# Patient Record
Sex: Male | Born: 1945
Health system: Southern US, Community
[De-identification: ages and names within clinical notes are randomized; demographics above are authoritative.]

## PROBLEM LIST (undated history)

## (undated) DIAGNOSIS — H269 Unspecified cataract: Secondary | ICD-10-CM

## (undated) DIAGNOSIS — G510 Bell's palsy: Secondary | ICD-10-CM

## (undated) DIAGNOSIS — E119 Type 2 diabetes mellitus without complications: Secondary | ICD-10-CM

## (undated) DIAGNOSIS — I10 Essential (primary) hypertension: Secondary | ICD-10-CM

## (undated) DIAGNOSIS — F419 Anxiety disorder, unspecified: Secondary | ICD-10-CM

## (undated) HISTORY — DX: Unspecified cataract: H26.9

## (undated) HISTORY — PX: COLONOSCOPY: SHX174

## (undated) HISTORY — DX: Anxiety disorder, unspecified: F41.9

## (undated) HISTORY — DX: Type 2 diabetes mellitus without complications: E11.9

## (undated) HISTORY — DX: Essential (primary) hypertension: I10

## (undated) HISTORY — DX: Bell's palsy: G51.0

---

## 2002-12-01 HISTORY — PX: POLYPECTOMY: SHX149

## 2015-03-12 DIAGNOSIS — E1122 Type 2 diabetes mellitus with diabetic chronic kidney disease: Secondary | ICD-10-CM | POA: Diagnosis not present

## 2015-03-12 DIAGNOSIS — I10 Essential (primary) hypertension: Secondary | ICD-10-CM | POA: Diagnosis not present

## 2015-03-20 DIAGNOSIS — Z23 Encounter for immunization: Secondary | ICD-10-CM | POA: Diagnosis not present

## 2015-03-20 DIAGNOSIS — E1122 Type 2 diabetes mellitus with diabetic chronic kidney disease: Secondary | ICD-10-CM | POA: Diagnosis not present

## 2015-03-20 DIAGNOSIS — N182 Chronic kidney disease, stage 2 (mild): Secondary | ICD-10-CM | POA: Diagnosis not present

## 2015-03-20 DIAGNOSIS — I10 Essential (primary) hypertension: Secondary | ICD-10-CM | POA: Diagnosis not present

## 2015-03-20 DIAGNOSIS — E785 Hyperlipidemia, unspecified: Secondary | ICD-10-CM | POA: Diagnosis not present

## 2015-06-26 DIAGNOSIS — H2513 Age-related nuclear cataract, bilateral: Secondary | ICD-10-CM | POA: Diagnosis not present

## 2015-06-26 DIAGNOSIS — H18413 Arcus senilis, bilateral: Secondary | ICD-10-CM | POA: Diagnosis not present

## 2015-12-21 DIAGNOSIS — Z125 Encounter for screening for malignant neoplasm of prostate: Secondary | ICD-10-CM | POA: Diagnosis not present

## 2015-12-21 DIAGNOSIS — E1122 Type 2 diabetes mellitus with diabetic chronic kidney disease: Secondary | ICD-10-CM | POA: Diagnosis not present

## 2015-12-21 DIAGNOSIS — I10 Essential (primary) hypertension: Secondary | ICD-10-CM | POA: Diagnosis not present

## 2016-01-29 DIAGNOSIS — H34811 Central retinal vein occlusion, right eye, with macular edema: Secondary | ICD-10-CM | POA: Diagnosis not present

## 2016-02-26 DIAGNOSIS — Z23 Encounter for immunization: Secondary | ICD-10-CM | POA: Diagnosis not present

## 2016-02-26 DIAGNOSIS — I129 Hypertensive chronic kidney disease with stage 1 through stage 4 chronic kidney disease, or unspecified chronic kidney disease: Secondary | ICD-10-CM | POA: Diagnosis not present

## 2016-02-26 DIAGNOSIS — E785 Hyperlipidemia, unspecified: Secondary | ICD-10-CM | POA: Diagnosis not present

## 2016-02-26 DIAGNOSIS — Z0001 Encounter for general adult medical examination with abnormal findings: Secondary | ICD-10-CM | POA: Diagnosis not present

## 2016-02-26 DIAGNOSIS — Z1389 Encounter for screening for other disorder: Secondary | ICD-10-CM | POA: Diagnosis not present

## 2016-02-26 DIAGNOSIS — N182 Chronic kidney disease, stage 2 (mild): Secondary | ICD-10-CM | POA: Diagnosis not present

## 2016-02-26 DIAGNOSIS — E1122 Type 2 diabetes mellitus with diabetic chronic kidney disease: Secondary | ICD-10-CM | POA: Diagnosis not present

## 2016-03-12 DIAGNOSIS — H3581 Retinal edema: Secondary | ICD-10-CM | POA: Diagnosis not present

## 2016-03-12 DIAGNOSIS — H3561 Retinal hemorrhage, right eye: Secondary | ICD-10-CM | POA: Diagnosis not present

## 2016-03-12 DIAGNOSIS — H43811 Vitreous degeneration, right eye: Secondary | ICD-10-CM | POA: Diagnosis not present

## 2016-03-12 DIAGNOSIS — H34811 Central retinal vein occlusion, right eye, with macular edema: Secondary | ICD-10-CM | POA: Diagnosis not present

## 2016-04-09 DIAGNOSIS — H34811 Central retinal vein occlusion, right eye, with macular edema: Secondary | ICD-10-CM | POA: Diagnosis not present

## 2016-04-23 DIAGNOSIS — H34811 Central retinal vein occlusion, right eye, with macular edema: Secondary | ICD-10-CM | POA: Diagnosis not present

## 2016-04-23 DIAGNOSIS — H43822 Vitreomacular adhesion, left eye: Secondary | ICD-10-CM | POA: Diagnosis not present

## 2016-05-21 DIAGNOSIS — E1122 Type 2 diabetes mellitus with diabetic chronic kidney disease: Secondary | ICD-10-CM | POA: Diagnosis not present

## 2016-05-21 DIAGNOSIS — I129 Hypertensive chronic kidney disease with stage 1 through stage 4 chronic kidney disease, or unspecified chronic kidney disease: Secondary | ICD-10-CM | POA: Diagnosis not present

## 2016-05-28 DIAGNOSIS — I129 Hypertensive chronic kidney disease with stage 1 through stage 4 chronic kidney disease, or unspecified chronic kidney disease: Secondary | ICD-10-CM | POA: Diagnosis not present

## 2016-05-28 DIAGNOSIS — N182 Chronic kidney disease, stage 2 (mild): Secondary | ICD-10-CM | POA: Diagnosis not present

## 2016-05-28 DIAGNOSIS — E1122 Type 2 diabetes mellitus with diabetic chronic kidney disease: Secondary | ICD-10-CM | POA: Diagnosis not present

## 2016-11-20 DIAGNOSIS — E1122 Type 2 diabetes mellitus with diabetic chronic kidney disease: Secondary | ICD-10-CM | POA: Diagnosis not present

## 2016-11-20 DIAGNOSIS — I129 Hypertensive chronic kidney disease with stage 1 through stage 4 chronic kidney disease, or unspecified chronic kidney disease: Secondary | ICD-10-CM | POA: Diagnosis not present

## 2016-12-02 DIAGNOSIS — Z23 Encounter for immunization: Secondary | ICD-10-CM | POA: Diagnosis not present

## 2016-12-02 DIAGNOSIS — I129 Hypertensive chronic kidney disease with stage 1 through stage 4 chronic kidney disease, or unspecified chronic kidney disease: Secondary | ICD-10-CM | POA: Diagnosis not present

## 2016-12-02 DIAGNOSIS — E1122 Type 2 diabetes mellitus with diabetic chronic kidney disease: Secondary | ICD-10-CM | POA: Diagnosis not present

## 2016-12-02 DIAGNOSIS — N182 Chronic kidney disease, stage 2 (mild): Secondary | ICD-10-CM | POA: Diagnosis not present

## 2016-12-30 DIAGNOSIS — M109 Gout, unspecified: Secondary | ICD-10-CM | POA: Diagnosis not present

## 2017-02-24 DIAGNOSIS — M109 Gout, unspecified: Secondary | ICD-10-CM | POA: Diagnosis not present

## 2017-03-04 DIAGNOSIS — H52223 Regular astigmatism, bilateral: Secondary | ICD-10-CM | POA: Diagnosis not present

## 2017-03-04 DIAGNOSIS — H34811 Central retinal vein occlusion, right eye, with macular edema: Secondary | ICD-10-CM | POA: Diagnosis not present

## 2017-03-09 DIAGNOSIS — E1122 Type 2 diabetes mellitus with diabetic chronic kidney disease: Secondary | ICD-10-CM | POA: Diagnosis not present

## 2017-03-09 DIAGNOSIS — E559 Vitamin D deficiency, unspecified: Secondary | ICD-10-CM | POA: Diagnosis not present

## 2017-03-09 DIAGNOSIS — Z0000 Encounter for general adult medical examination without abnormal findings: Secondary | ICD-10-CM | POA: Diagnosis not present

## 2017-03-09 DIAGNOSIS — I10 Essential (primary) hypertension: Secondary | ICD-10-CM | POA: Diagnosis not present

## 2017-03-09 DIAGNOSIS — E785 Hyperlipidemia, unspecified: Secondary | ICD-10-CM | POA: Diagnosis not present

## 2017-03-09 DIAGNOSIS — M109 Gout, unspecified: Secondary | ICD-10-CM | POA: Diagnosis not present

## 2017-03-09 DIAGNOSIS — Z125 Encounter for screening for malignant neoplasm of prostate: Secondary | ICD-10-CM | POA: Diagnosis not present

## 2017-03-19 DIAGNOSIS — N182 Chronic kidney disease, stage 2 (mild): Secondary | ICD-10-CM | POA: Diagnosis not present

## 2017-03-19 DIAGNOSIS — H35032 Hypertensive retinopathy, left eye: Secondary | ICD-10-CM | POA: Diagnosis not present

## 2017-03-19 DIAGNOSIS — H34811 Central retinal vein occlusion, right eye, with macular edema: Secondary | ICD-10-CM | POA: Diagnosis not present

## 2017-03-19 DIAGNOSIS — E1122 Type 2 diabetes mellitus with diabetic chronic kidney disease: Secondary | ICD-10-CM | POA: Diagnosis not present

## 2017-03-19 DIAGNOSIS — H43813 Vitreous degeneration, bilateral: Secondary | ICD-10-CM | POA: Diagnosis not present

## 2017-03-19 DIAGNOSIS — H43822 Vitreomacular adhesion, left eye: Secondary | ICD-10-CM | POA: Diagnosis not present

## 2017-03-19 DIAGNOSIS — Z0000 Encounter for general adult medical examination without abnormal findings: Secondary | ICD-10-CM | POA: Diagnosis not present

## 2017-03-26 DIAGNOSIS — H34811 Central retinal vein occlusion, right eye, with macular edema: Secondary | ICD-10-CM | POA: Diagnosis not present

## 2017-04-16 DIAGNOSIS — H34811 Central retinal vein occlusion, right eye, with macular edema: Secondary | ICD-10-CM | POA: Diagnosis not present

## 2017-04-16 DIAGNOSIS — H35032 Hypertensive retinopathy, left eye: Secondary | ICD-10-CM | POA: Diagnosis not present

## 2017-04-16 DIAGNOSIS — H43822 Vitreomacular adhesion, left eye: Secondary | ICD-10-CM | POA: Diagnosis not present

## 2017-04-16 DIAGNOSIS — H43813 Vitreous degeneration, bilateral: Secondary | ICD-10-CM | POA: Diagnosis not present

## 2017-06-04 DIAGNOSIS — E559 Vitamin D deficiency, unspecified: Secondary | ICD-10-CM | POA: Diagnosis not present

## 2017-07-16 DIAGNOSIS — H34811 Central retinal vein occlusion, right eye, with macular edema: Secondary | ICD-10-CM | POA: Diagnosis not present

## 2017-07-16 DIAGNOSIS — H43822 Vitreomacular adhesion, left eye: Secondary | ICD-10-CM | POA: Diagnosis not present

## 2017-07-16 DIAGNOSIS — H43813 Vitreous degeneration, bilateral: Secondary | ICD-10-CM | POA: Diagnosis not present

## 2017-07-16 DIAGNOSIS — H35032 Hypertensive retinopathy, left eye: Secondary | ICD-10-CM | POA: Diagnosis not present

## 2017-08-06 DIAGNOSIS — H34811 Central retinal vein occlusion, right eye, with macular edema: Secondary | ICD-10-CM | POA: Diagnosis not present

## 2017-08-27 DIAGNOSIS — H35032 Hypertensive retinopathy, left eye: Secondary | ICD-10-CM | POA: Diagnosis not present

## 2017-08-27 DIAGNOSIS — H43822 Vitreomacular adhesion, left eye: Secondary | ICD-10-CM | POA: Diagnosis not present

## 2017-08-27 DIAGNOSIS — H34811 Central retinal vein occlusion, right eye, with macular edema: Secondary | ICD-10-CM | POA: Diagnosis not present

## 2017-08-27 DIAGNOSIS — H43813 Vitreous degeneration, bilateral: Secondary | ICD-10-CM | POA: Diagnosis not present

## 2017-09-14 DIAGNOSIS — I129 Hypertensive chronic kidney disease with stage 1 through stage 4 chronic kidney disease, or unspecified chronic kidney disease: Secondary | ICD-10-CM | POA: Diagnosis not present

## 2017-09-14 DIAGNOSIS — M109 Gout, unspecified: Secondary | ICD-10-CM | POA: Diagnosis not present

## 2017-09-14 DIAGNOSIS — E1122 Type 2 diabetes mellitus with diabetic chronic kidney disease: Secondary | ICD-10-CM | POA: Diagnosis not present

## 2017-09-14 DIAGNOSIS — E785 Hyperlipidemia, unspecified: Secondary | ICD-10-CM | POA: Diagnosis not present

## 2017-09-14 DIAGNOSIS — E559 Vitamin D deficiency, unspecified: Secondary | ICD-10-CM | POA: Diagnosis not present

## 2017-09-18 DIAGNOSIS — L03019 Cellulitis of unspecified finger: Secondary | ICD-10-CM | POA: Diagnosis not present

## 2017-09-21 DIAGNOSIS — Z1211 Encounter for screening for malignant neoplasm of colon: Secondary | ICD-10-CM | POA: Diagnosis not present

## 2017-09-21 DIAGNOSIS — I129 Hypertensive chronic kidney disease with stage 1 through stage 4 chronic kidney disease, or unspecified chronic kidney disease: Secondary | ICD-10-CM | POA: Diagnosis not present

## 2017-09-21 DIAGNOSIS — E785 Hyperlipidemia, unspecified: Secondary | ICD-10-CM | POA: Diagnosis not present

## 2017-09-21 DIAGNOSIS — E1122 Type 2 diabetes mellitus with diabetic chronic kidney disease: Secondary | ICD-10-CM | POA: Diagnosis not present

## 2017-09-21 DIAGNOSIS — Z23 Encounter for immunization: Secondary | ICD-10-CM | POA: Diagnosis not present

## 2017-10-16 DIAGNOSIS — T1490XA Injury, unspecified, initial encounter: Secondary | ICD-10-CM | POA: Diagnosis not present

## 2017-10-29 DIAGNOSIS — H43813 Vitreous degeneration, bilateral: Secondary | ICD-10-CM | POA: Diagnosis not present

## 2017-10-29 DIAGNOSIS — E119 Type 2 diabetes mellitus without complications: Secondary | ICD-10-CM | POA: Diagnosis not present

## 2017-10-29 DIAGNOSIS — H43822 Vitreomacular adhesion, left eye: Secondary | ICD-10-CM | POA: Diagnosis not present

## 2017-10-29 DIAGNOSIS — H35033 Hypertensive retinopathy, bilateral: Secondary | ICD-10-CM | POA: Diagnosis not present

## 2017-11-19 DIAGNOSIS — H34811 Central retinal vein occlusion, right eye, with macular edema: Secondary | ICD-10-CM | POA: Diagnosis not present

## 2017-12-17 DIAGNOSIS — H35033 Hypertensive retinopathy, bilateral: Secondary | ICD-10-CM | POA: Diagnosis not present

## 2017-12-17 DIAGNOSIS — H34811 Central retinal vein occlusion, right eye, with macular edema: Secondary | ICD-10-CM | POA: Diagnosis not present

## 2017-12-17 DIAGNOSIS — H43822 Vitreomacular adhesion, left eye: Secondary | ICD-10-CM | POA: Diagnosis not present

## 2017-12-17 DIAGNOSIS — H43813 Vitreous degeneration, bilateral: Secondary | ICD-10-CM | POA: Diagnosis not present

## 2018-01-08 DIAGNOSIS — R399 Unspecified symptoms and signs involving the genitourinary system: Secondary | ICD-10-CM | POA: Diagnosis not present

## 2018-03-16 DIAGNOSIS — Z0000 Encounter for general adult medical examination without abnormal findings: Secondary | ICD-10-CM | POA: Diagnosis not present

## 2018-03-16 DIAGNOSIS — E1122 Type 2 diabetes mellitus with diabetic chronic kidney disease: Secondary | ICD-10-CM | POA: Diagnosis not present

## 2018-03-16 DIAGNOSIS — M109 Gout, unspecified: Secondary | ICD-10-CM | POA: Diagnosis not present

## 2018-03-16 DIAGNOSIS — E785 Hyperlipidemia, unspecified: Secondary | ICD-10-CM | POA: Diagnosis not present

## 2018-03-16 DIAGNOSIS — I129 Hypertensive chronic kidney disease with stage 1 through stage 4 chronic kidney disease, or unspecified chronic kidney disease: Secondary | ICD-10-CM | POA: Diagnosis not present

## 2018-03-18 DIAGNOSIS — H34811 Central retinal vein occlusion, right eye, with macular edema: Secondary | ICD-10-CM | POA: Diagnosis not present

## 2018-03-18 DIAGNOSIS — H43822 Vitreomacular adhesion, left eye: Secondary | ICD-10-CM | POA: Diagnosis not present

## 2018-03-18 DIAGNOSIS — H43813 Vitreous degeneration, bilateral: Secondary | ICD-10-CM | POA: Diagnosis not present

## 2018-03-18 DIAGNOSIS — H35033 Hypertensive retinopathy, bilateral: Secondary | ICD-10-CM | POA: Diagnosis not present

## 2018-03-23 DIAGNOSIS — N182 Chronic kidney disease, stage 2 (mild): Secondary | ICD-10-CM | POA: Diagnosis not present

## 2018-03-23 DIAGNOSIS — Z0000 Encounter for general adult medical examination without abnormal findings: Secondary | ICD-10-CM | POA: Diagnosis not present

## 2018-03-23 DIAGNOSIS — I129 Hypertensive chronic kidney disease with stage 1 through stage 4 chronic kidney disease, or unspecified chronic kidney disease: Secondary | ICD-10-CM | POA: Diagnosis not present

## 2018-03-23 DIAGNOSIS — E1122 Type 2 diabetes mellitus with diabetic chronic kidney disease: Secondary | ICD-10-CM | POA: Diagnosis not present

## 2018-03-30 ENCOUNTER — Encounter: Payer: Self-pay | Admitting: Gastroenterology

## 2018-04-08 DIAGNOSIS — H34811 Central retinal vein occlusion, right eye, with macular edema: Secondary | ICD-10-CM | POA: Diagnosis not present

## 2018-04-08 DIAGNOSIS — H35033 Hypertensive retinopathy, bilateral: Secondary | ICD-10-CM | POA: Diagnosis not present

## 2018-04-08 DIAGNOSIS — H43813 Vitreous degeneration, bilateral: Secondary | ICD-10-CM | POA: Diagnosis not present

## 2018-05-03 DIAGNOSIS — H2513 Age-related nuclear cataract, bilateral: Secondary | ICD-10-CM | POA: Diagnosis not present

## 2018-05-03 DIAGNOSIS — H524 Presbyopia: Secondary | ICD-10-CM | POA: Diagnosis not present

## 2018-05-18 ENCOUNTER — Other Ambulatory Visit: Payer: Self-pay

## 2018-05-18 ENCOUNTER — Ambulatory Visit (AMBULATORY_SURGERY_CENTER): Payer: Self-pay | Admitting: *Deleted

## 2018-05-18 ENCOUNTER — Encounter: Payer: Self-pay | Admitting: Gastroenterology

## 2018-05-18 VITALS — Ht 70.0 in | Wt 233.2 lb

## 2018-05-18 DIAGNOSIS — Z1211 Encounter for screening for malignant neoplasm of colon: Secondary | ICD-10-CM

## 2018-05-18 MED ORDER — PEG 3350-KCL-NA BICARB-NACL 420 G PO SOLR: 4000.0000 mL | Freq: Once | ORAL | 0 refills | Status: AC

## 2018-05-18 NOTE — Progress Notes (Signed)
No egg or soy allergy known to patient  No issues with past sedation with any surgeries  or procedures, no intubation problems  No diet pills per patient No home 02 use per patient  No blood thinners per patient  Pt denies issues with constipation  No A fib or A flutter  EMMI video sent to pt's e mail  

## 2018-05-24 ENCOUNTER — Telehealth: Payer: Self-pay | Admitting: Gastroenterology

## 2018-05-24 DIAGNOSIS — Z1211 Encounter for screening for malignant neoplasm of colon: Secondary | ICD-10-CM

## 2018-05-24 MED ORDER — PEG 3350-KCL-NA BICARB-NACL 420 G PO SOLR: 4000.0000 mL | Freq: Once | ORAL | 0 refills | Status: AC

## 2018-05-24 NOTE — Telephone Encounter (Signed)
Spoke with patient. He states [facility] did not have RX a few days ago. Confirmed pharmacy and resent Golytely prep to pharmacy today. Pt aware.

## 2018-06-01 ENCOUNTER — Encounter: Payer: Self-pay | Admitting: Gastroenterology

## 2018-06-01 ENCOUNTER — Ambulatory Visit (AMBULATORY_SURGERY_CENTER): Payer: Medicare Other | Admitting: Gastroenterology

## 2018-06-01 ENCOUNTER — Other Ambulatory Visit: Payer: Self-pay

## 2018-06-01 VITALS — BP 144/84 | HR 65 | Temp 97.5°F | Resp 15 | Ht 70.0 in | Wt 233.0 lb

## 2018-06-01 DIAGNOSIS — K635 Polyp of colon: Secondary | ICD-10-CM

## 2018-06-01 DIAGNOSIS — D123 Benign neoplasm of transverse colon: Secondary | ICD-10-CM

## 2018-06-01 DIAGNOSIS — Z1211 Encounter for screening for malignant neoplasm of colon: Secondary | ICD-10-CM

## 2018-06-01 DIAGNOSIS — D122 Benign neoplasm of ascending colon: Secondary | ICD-10-CM | POA: Diagnosis not present

## 2018-06-01 HISTORY — PX: COLONOSCOPY: SHX174

## 2018-06-01 MED ORDER — SODIUM CHLORIDE 0.9 % IV SOLN
500.0000 mL | Freq: Once | INTRAVENOUS | Status: DC
Start: 2018-06-01 — End: 2020-02-07

## 2018-06-01 NOTE — Progress Notes (Signed)
To Pacu, VSS. Report to [email or web address] 

## 2018-06-01 NOTE — Progress Notes (Signed)
Called to room to assist during endoscopic procedure.  Patient ID and intended procedure confirmed with present staff. Received instructions for my participation in the procedure from the performing physician.  

## 2018-06-01 NOTE — Patient Instructions (Signed)
HANDOUTS GIVEN FOR DIVERTICULOSIS AND POLYPS  DO NOT TAKE ANY (NSAIDS) IBUPROFEN, NAPROXEN, OR ALEVE FOR 5 DAYS.  YOU MAKE TAKE TYLENOL IF NEEDED.  YOU HAD AN ENDOSCOPIC PROCEDURE TODAY AT [facility]:   Refer to the procedure report that was given to you for any specific questions about what was found during the examination.  If the procedure report does not answer your questions, please call your gastroenterologist to clarify.  If you requested that your care partner not be given the details of your procedure findings, then the procedure report has been included in a sealed envelope for you to review at your convenience later.  YOU SHOULD EXPECT: Some feelings of bloating in the abdomen. Passage of more gas than usual.  Walking can help get rid of the air that was put into your GI tract during the procedure and reduce the bloating. If you had a lower endoscopy (such as a colonoscopy or flexible sigmoidoscopy) you may notice spotting of blood in your stool or on the toilet paper. If you underwent a bowel prep for your procedure, you may not have a normal bowel movement for a few days.  Please Note:  You might notice some irritation and congestion in your nose or some drainage.  This is from the oxygen used during your procedure.  There is no need for concern and it should clear up in a day or so.  SYMPTOMS TO REPORT IMMEDIATELY:   Following lower endoscopy (colonoscopy or flexible sigmoidoscopy):  Excessive amounts of blood in the stool  Significant tenderness or worsening of abdominal pains  Swelling of the abdomen that is new, acute  Fever of 100F or higher   For urgent or emergent issues, a gastroenterologist can be reached at any hour by calling [phone].   DIET:  We do recommend a small meal at first, but then you may proceed to your regular diet.  Drink plenty of fluids but you should avoid alcoholic beverages for 24 hours.  ACTIVITY:  You should plan to take  it easy for the rest of today and you should NOT DRIVE or use heavy machinery until tomorrow (because of the sedation medicines used during the test).    FOLLOW UP: Our staff will call the number listed on your records the next business day following your procedure to check on you and address any questions or concerns that you may have regarding the information given to you following your procedure. If we do not reach you, we will leave a message.  However, if you are feeling well and you are not experiencing any problems, there is no need to return our call.  We will assume that you have returned to your regular daily activities without incident.  If any biopsies were taken you will be contacted by phone or by letter within the next 1-3 weeks.  Please call us at [phone] if you have not heard about the biopsies in 3 weeks.    SIGNATURES/CONFIDENTIALITY: You and/or your care partner have signed paperwork which will be entered into your electronic medical record.  These signatures attest to the fact that that the information above on your After Visit Summary has been reviewed and is understood.  Full responsibility of the confidentiality of this discharge information lies with you and/or your care-partner.

## 2018-06-01 NOTE — Op Note (Signed)
[facility] Patient Name: Jason Shaffer Procedure Date: 06/01/2018 9:42 AM MRN: [id number] Endoscopist: Mallie Mussel L. Loletha Carrow , MD Age: 72 Referring MD:  Date of Birth: July 11, 1946 Gender: Male Account #: [id number] Procedure:                Colonoscopy Indications:              Screening for colorectal malignant neoplasm                            (reportedly last colon without polyps > 10 years                            ago) Medicines:                Monitored Anesthesia Care Procedure:                Pre-Anesthesia Assessment:                           - Prior to the procedure, a History and Physical                            was performed, and patient medications and                            allergies were reviewed. The patient's tolerance of                            previous anesthesia was also reviewed. The risks                            and benefits of the procedure and the sedation                            options and risks were discussed with the patient.                            All questions were answered, and informed consent                            was obtained. Prior Anticoagulants: The patient has                            taken no previous anticoagulant or antiplatelet                            agents. ASA Grade Assessment: II - A patient with                            mild systemic disease. After reviewing the risks                            and benefits, the patient was deemed in  satisfactory condition to undergo the procedure.                           After obtaining informed consent, the colonoscope                            was passed under direct vision. Throughout the                            procedure, the patient's blood pressure, pulse, and                            oxygen saturations were monitored continuously. The                            Colonoscope was introduced through the anus and             advanced to the the cecum, identified by                            appendiceal orifice and ileocecal valve. The                            colonoscopy was performed with difficulty due to                            multiple diverticula in the colon, a redundant                            colon and a tortuous colon. The patient tolerated                            the procedure well. The quality of the bowel                            preparation was good. The ileocecal valve,                            appendiceal orifice, and rectum were photographed.                            The quality of the bowel preparation was evaluated                            using the BBPS [facility] Bowel Preparation Scale)                            with scores of: Right Colon = 2, Transverse Colon =                            2 and Left Colon = 2. The total BBPS score equals  6.,after lavage. The bowel preparation used was                            GoLYTELY. Scope In: 9:50:04 AM Scope Out: 10:23:02 AM Scope Withdrawal Time: 0 hours 27 minutes 10 seconds  Total Procedure Duration: 0 hours 32 minutes 58 seconds  Findings:                 The perianal and digital rectal examinations were                            normal.                           Many small and large-mouthed diverticula were found                            in the left colon.                           The colon (entire examined portion) was                            significantly redundant.                           A 15 mm polyp was found in the proximal ascending                            colon. The polyp was ulcerated, semi-pedunculated,                            with a broad base. The polyp was removed with a                            piecemeal technique using a hot snare. Resection                            and retrieval were complete.                           Two sessile polyps were found in  the distal                            transverse colon and distal ascending colon. The                            polyps were 6 to 8 mm in size. These polyps were                            removed with a cold snare. Resection and retrieval                            were complete.  The exam was otherwise without abnormality on                            direct and retroflexion views. Complications:            No immediate complications. Estimated Blood Loss:     Estimated blood loss was minimal. Impression:               - Diverticulosis in the left colon.                           - Redundant colon.                           - One 15 mm polyp in the proximal ascending colon,                            removed piecemeal using a hot snare. Resected and                            retrieved.                           - Two 6 to 8 mm polyps in the distal transverse                            colon and in the distal ascending colon, removed                            with a cold snare. Resected and retrieved.                           - The examination was otherwise normal on direct                            and retroflexion views. Recommendation:           - Patient has a contact number available for                            emergencies. The signs and symptoms of potential                            delayed complications were discussed with the                            patient. Return to normal activities tomorrow.                            Written discharge instructions were provided to the                            patient.                           - Resume previous diet.                           -  Continue present medications.                           - No aspirin, ibuprofen, naproxen, or other                            non-steroidal anti-inflammatory drugs for 5 days                            after polyp removal.                           - Await  pathology results.                           - Repeat colonoscopy is recommended for                            surveillance. The colonoscopy date will be                            determined after pathology results from today's                            exam become available for review.  L. Loletha Carrow, MD 06/01/2018 10:31:53 AM This report has been signed electronically.

## 2018-06-02 ENCOUNTER — Telehealth: Payer: Self-pay | Admitting: *Deleted

## 2018-06-02 NOTE — Telephone Encounter (Signed)
  Follow up Call-  Call back number 06/01/2018  Post procedure Call Back phone  # [phone]  Permission to leave phone message Yes  Some recent data might be hidden     Patient questions:  Do you have a fever, pain , or abdominal swelling? No. Pain Score  0 *  Have you tolerated food without any problems? Yes.    Have you been able to return to your normal activities? Yes.    Do you have any questions about your discharge instructions: Diet   No. Medications  No. Follow up visit             No  Do you have questions or concerns about your Care? No.  Actions: * If pain score is 4 or above: No action needed, pain <4.

## 2018-06-07 ENCOUNTER — Encounter: Payer: Self-pay | Admitting: Gastroenterology

## 2018-06-09 DIAGNOSIS — H25811 Combined forms of age-related cataract, right eye: Secondary | ICD-10-CM | POA: Diagnosis not present

## 2018-06-09 DIAGNOSIS — H2511 Age-related nuclear cataract, right eye: Secondary | ICD-10-CM | POA: Diagnosis not present

## 2018-06-11 ENCOUNTER — Telehealth: Payer: Self-pay | Admitting: Gastroenterology

## 2018-06-11 DIAGNOSIS — K921 Melena: Secondary | ICD-10-CM

## 2018-06-11 NOTE — Telephone Encounter (Signed)
Routed to Dr. Danis. 

## 2018-06-11 NOTE — Telephone Encounter (Signed)
Dr.Danis has left for the day, routed to DOD. Patient states he had normal BM up until last night and today, then noticed some blood in the stool. He had colonoscopy on 7/2 with polyps removed, he waited until 7/8 took Aleve and on 7/10 took Alkaseltzer it was after that he noticed blood. Denies any SOB, or chest pain. Please advise.

## 2018-06-14 NOTE — Telephone Encounter (Signed)
Patient notified of the recommendations.  He will come for labs tomorrow.

## 2018-06-14 NOTE — Addendum Note (Signed)
Addended by: Marlon Pel on: 06/14/2018 12:02 PM   Modules accepted: Orders

## 2018-06-14 NOTE — Telephone Encounter (Signed)
Patient reports that he bleeding has improved.  He does report that he is seeing dark blood in the stool with a BM. He reports that he is having one stool a day.  He denies any other GI symptoms or complaints.

## 2018-06-14 NOTE — Telephone Encounter (Signed)
I did not see this until today  We need to f/u w/ patient today - did not see any evidence of ED visit/hospitalization

## 2018-06-14 NOTE — Telephone Encounter (Signed)
OK  Please check CBC dx rectal bleeding

## 2018-06-15 ENCOUNTER — Other Ambulatory Visit (INDEPENDENT_AMBULATORY_CARE_PROVIDER_SITE_OTHER): Payer: Medicare Other

## 2018-06-15 DIAGNOSIS — K921 Melena: Secondary | ICD-10-CM

## 2018-06-15 LAB — CBC WITH DIFFERENTIAL/PLATELET
BASOS PCT: 1.1 % (ref 0.0–3.0)
Basophils Absolute: 0.1 10*3/uL (ref 0.0–0.1)
EOS ABS: 0.3 10*3/uL (ref 0.0–0.7)
Eosinophils Relative: 3.3 % (ref 0.0–5.0)
HCT: 37.7 % — ABNORMAL LOW (ref 39.0–52.0)
Hemoglobin: 12.9 g/dL — ABNORMAL LOW (ref 13.0–17.0)
LYMPHS ABS: 3.8 10*3/uL (ref 0.7–4.0)
Lymphocytes Relative: 42.1 % (ref 12.0–46.0)
MCHC: 34.2 g/dL (ref 30.0–36.0)
MCV: 87 fl (ref 78.0–100.0)
MONO ABS: 0.7 10*3/uL (ref 0.1–1.0)
Monocytes Relative: 7.2 % (ref 3.0–12.0)
NEUTROS PCT: 46.3 % (ref 43.0–77.0)
Neutro Abs: 4.2 10*3/uL (ref 1.4–7.7)
Platelets: 331 10*3/uL (ref 150.0–400.0)
RBC: 4.34 Mil/uL (ref 4.22–5.81)
RDW: 14.2 % (ref 11.5–15.5)
WBC: 9.1 10*3/uL (ref 4.0–10.5)

## 2018-06-16 NOTE — Telephone Encounter (Signed)
Please relay to patient.

## 2018-06-16 NOTE — Telephone Encounter (Signed)
His hemoglobin from 7/16 is normal.  I am out of the office this week and checking messages. Please let him know lab results, and advise avoid NSAIDs and alkaseltzer.  If what he describes continues, then he needs to be seen in clinic to examine him and see if it is truly blood.

## 2018-06-16 NOTE — Telephone Encounter (Signed)
Patient advised of results and recommendations. He stated that last night he did not see any blood, he is aware that should that change/worsen to contact office.

## 2018-06-16 NOTE — Progress Notes (Signed)
Hgb OK We were calling the patient

## 2018-06-17 DIAGNOSIS — H349 Unspecified retinal vascular occlusion: Secondary | ICD-10-CM | POA: Diagnosis not present

## 2018-06-17 DIAGNOSIS — H34811 Central retinal vein occlusion, right eye, with macular edema: Secondary | ICD-10-CM | POA: Diagnosis not present

## 2018-06-21 DIAGNOSIS — H43822 Vitreomacular adhesion, left eye: Secondary | ICD-10-CM | POA: Diagnosis not present

## 2018-06-21 DIAGNOSIS — H35033 Hypertensive retinopathy, bilateral: Secondary | ICD-10-CM | POA: Diagnosis not present

## 2018-06-21 DIAGNOSIS — H34811 Central retinal vein occlusion, right eye, with macular edema: Secondary | ICD-10-CM | POA: Diagnosis not present

## 2018-06-21 DIAGNOSIS — H43812 Vitreous degeneration, left eye: Secondary | ICD-10-CM | POA: Diagnosis not present

## 2018-07-29 DIAGNOSIS — H34811 Central retinal vein occlusion, right eye, with macular edema: Secondary | ICD-10-CM | POA: Diagnosis not present

## 2018-07-29 DIAGNOSIS — H43391 Other vitreous opacities, right eye: Secondary | ICD-10-CM | POA: Diagnosis not present

## 2018-07-29 DIAGNOSIS — H43813 Vitreous degeneration, bilateral: Secondary | ICD-10-CM | POA: Diagnosis not present

## 2018-07-29 DIAGNOSIS — H35033 Hypertensive retinopathy, bilateral: Secondary | ICD-10-CM | POA: Diagnosis not present

## 2018-08-24 ENCOUNTER — Other Ambulatory Visit: Payer: Self-pay

## 2018-08-24 NOTE — Patient Outreach (Signed)
[facility]) Care Management  08/24/2018  Jason Shaffer 1946/10/06 [id number]   Medication Adherence call to [name] patient did not answer and voice message is not set up to leave message patient is due on Metformin Er 500 mg. [name] is showing past due under [place].   [place] Management Direct Dial [phone]  Fax [phone] [email or web address]

## 2018-09-15 DIAGNOSIS — N390 Urinary tract infection, site not specified: Secondary | ICD-10-CM | POA: Diagnosis not present

## 2018-09-15 DIAGNOSIS — I129 Hypertensive chronic kidney disease with stage 1 through stage 4 chronic kidney disease, or unspecified chronic kidney disease: Secondary | ICD-10-CM | POA: Diagnosis not present

## 2018-09-15 DIAGNOSIS — E1122 Type 2 diabetes mellitus with diabetic chronic kidney disease: Secondary | ICD-10-CM | POA: Diagnosis not present

## 2018-09-15 DIAGNOSIS — M109 Gout, unspecified: Secondary | ICD-10-CM | POA: Diagnosis not present

## 2018-09-15 DIAGNOSIS — E785 Hyperlipidemia, unspecified: Secondary | ICD-10-CM | POA: Diagnosis not present

## 2018-09-23 DIAGNOSIS — H34811 Central retinal vein occlusion, right eye, with macular edema: Secondary | ICD-10-CM | POA: Diagnosis not present

## 2018-10-19 DIAGNOSIS — E1122 Type 2 diabetes mellitus with diabetic chronic kidney disease: Secondary | ICD-10-CM | POA: Diagnosis not present

## 2018-10-19 DIAGNOSIS — I129 Hypertensive chronic kidney disease with stage 1 through stage 4 chronic kidney disease, or unspecified chronic kidney disease: Secondary | ICD-10-CM | POA: Diagnosis not present

## 2018-10-19 DIAGNOSIS — N182 Chronic kidney disease, stage 2 (mild): Secondary | ICD-10-CM | POA: Diagnosis not present

## 2018-10-19 DIAGNOSIS — E785 Hyperlipidemia, unspecified: Secondary | ICD-10-CM | POA: Diagnosis not present

## 2018-11-19 DIAGNOSIS — H35033 Hypertensive retinopathy, bilateral: Secondary | ICD-10-CM | POA: Diagnosis not present

## 2018-11-19 DIAGNOSIS — H43822 Vitreomacular adhesion, left eye: Secondary | ICD-10-CM | POA: Diagnosis not present

## 2018-11-19 DIAGNOSIS — H35372 Puckering of macula, left eye: Secondary | ICD-10-CM | POA: Diagnosis not present

## 2018-11-19 DIAGNOSIS — H34811 Central retinal vein occlusion, right eye, with macular edema: Secondary | ICD-10-CM | POA: Diagnosis not present

## 2018-12-13 DIAGNOSIS — H34811 Central retinal vein occlusion, right eye, with macular edema: Secondary | ICD-10-CM | POA: Diagnosis not present

## 2019-01-03 DIAGNOSIS — H35372 Puckering of macula, left eye: Secondary | ICD-10-CM | POA: Diagnosis not present

## 2019-01-03 DIAGNOSIS — H43813 Vitreous degeneration, bilateral: Secondary | ICD-10-CM | POA: Diagnosis not present

## 2019-01-03 DIAGNOSIS — H34811 Central retinal vein occlusion, right eye, with macular edema: Secondary | ICD-10-CM | POA: Diagnosis not present

## 2019-01-03 DIAGNOSIS — H35033 Hypertensive retinopathy, bilateral: Secondary | ICD-10-CM | POA: Diagnosis not present

## 2019-01-12 DIAGNOSIS — Z23 Encounter for immunization: Secondary | ICD-10-CM | POA: Diagnosis not present

## 2019-01-31 DIAGNOSIS — H34811 Central retinal vein occlusion, right eye, with macular edema: Secondary | ICD-10-CM | POA: Diagnosis not present

## 2019-01-31 DIAGNOSIS — H35033 Hypertensive retinopathy, bilateral: Secondary | ICD-10-CM | POA: Diagnosis not present

## 2019-01-31 DIAGNOSIS — H43813 Vitreous degeneration, bilateral: Secondary | ICD-10-CM | POA: Diagnosis not present

## 2019-01-31 DIAGNOSIS — H35372 Puckering of macula, left eye: Secondary | ICD-10-CM | POA: Diagnosis not present

## 2019-02-03 DIAGNOSIS — H2512 Age-related nuclear cataract, left eye: Secondary | ICD-10-CM | POA: Diagnosis not present

## 2019-02-03 DIAGNOSIS — H35351 Cystoid macular degeneration, right eye: Secondary | ICD-10-CM | POA: Diagnosis not present

## 2019-02-03 DIAGNOSIS — Z961 Presence of intraocular lens: Secondary | ICD-10-CM | POA: Diagnosis not present

## 2019-03-22 DIAGNOSIS — H34811 Central retinal vein occlusion, right eye, with macular edema: Secondary | ICD-10-CM | POA: Diagnosis not present

## 2019-04-07 DIAGNOSIS — H43812 Vitreous degeneration, left eye: Secondary | ICD-10-CM | POA: Diagnosis not present

## 2019-04-07 DIAGNOSIS — H34811 Central retinal vein occlusion, right eye, with macular edema: Secondary | ICD-10-CM | POA: Diagnosis not present

## 2019-04-12 DIAGNOSIS — Z0000 Encounter for general adult medical examination without abnormal findings: Secondary | ICD-10-CM | POA: Diagnosis not present

## 2019-04-12 DIAGNOSIS — I129 Hypertensive chronic kidney disease with stage 1 through stage 4 chronic kidney disease, or unspecified chronic kidney disease: Secondary | ICD-10-CM | POA: Diagnosis not present

## 2019-04-12 DIAGNOSIS — E1122 Type 2 diabetes mellitus with diabetic chronic kidney disease: Secondary | ICD-10-CM | POA: Diagnosis not present

## 2019-04-12 DIAGNOSIS — E559 Vitamin D deficiency, unspecified: Secondary | ICD-10-CM | POA: Diagnosis not present

## 2019-04-12 DIAGNOSIS — E785 Hyperlipidemia, unspecified: Secondary | ICD-10-CM | POA: Diagnosis not present

## 2019-04-12 DIAGNOSIS — M109 Gout, unspecified: Secondary | ICD-10-CM | POA: Diagnosis not present

## 2019-04-12 DIAGNOSIS — N182 Chronic kidney disease, stage 2 (mild): Secondary | ICD-10-CM | POA: Diagnosis not present

## 2019-04-26 DIAGNOSIS — Z0000 Encounter for general adult medical examination without abnormal findings: Secondary | ICD-10-CM | POA: Diagnosis not present

## 2019-04-26 DIAGNOSIS — I129 Hypertensive chronic kidney disease with stage 1 through stage 4 chronic kidney disease, or unspecified chronic kidney disease: Secondary | ICD-10-CM | POA: Diagnosis not present

## 2019-06-02 DIAGNOSIS — H35372 Puckering of macula, left eye: Secondary | ICD-10-CM | POA: Diagnosis not present

## 2019-06-02 DIAGNOSIS — H34811 Central retinal vein occlusion, right eye, with macular edema: Secondary | ICD-10-CM | POA: Diagnosis not present

## 2019-06-02 DIAGNOSIS — H35033 Hypertensive retinopathy, bilateral: Secondary | ICD-10-CM | POA: Diagnosis not present

## 2019-06-02 DIAGNOSIS — H43813 Vitreous degeneration, bilateral: Secondary | ICD-10-CM | POA: Diagnosis not present

## 2019-06-14 ENCOUNTER — Encounter: Payer: Self-pay | Admitting: Gastroenterology

## 2019-07-13 DIAGNOSIS — H43812 Vitreous degeneration, left eye: Secondary | ICD-10-CM | POA: Diagnosis not present

## 2019-07-13 DIAGNOSIS — H34811 Central retinal vein occlusion, right eye, with macular edema: Secondary | ICD-10-CM | POA: Diagnosis not present

## 2019-09-06 DIAGNOSIS — Z23 Encounter for immunization: Secondary | ICD-10-CM | POA: Diagnosis not present

## 2019-09-14 DIAGNOSIS — H34811 Central retinal vein occlusion, right eye, with macular edema: Secondary | ICD-10-CM | POA: Diagnosis not present

## 2019-09-14 DIAGNOSIS — H43812 Vitreous degeneration, left eye: Secondary | ICD-10-CM | POA: Diagnosis not present

## 2019-10-18 DIAGNOSIS — E785 Hyperlipidemia, unspecified: Secondary | ICD-10-CM | POA: Diagnosis not present

## 2019-10-18 DIAGNOSIS — E1122 Type 2 diabetes mellitus with diabetic chronic kidney disease: Secondary | ICD-10-CM | POA: Diagnosis not present

## 2019-10-18 DIAGNOSIS — I129 Hypertensive chronic kidney disease with stage 1 through stage 4 chronic kidney disease, or unspecified chronic kidney disease: Secondary | ICD-10-CM | POA: Diagnosis not present

## 2019-10-18 DIAGNOSIS — E559 Vitamin D deficiency, unspecified: Secondary | ICD-10-CM | POA: Diagnosis not present

## 2019-10-19 DIAGNOSIS — H34811 Central retinal vein occlusion, right eye, with macular edema: Secondary | ICD-10-CM | POA: Diagnosis not present

## 2019-10-19 DIAGNOSIS — H43813 Vitreous degeneration, bilateral: Secondary | ICD-10-CM | POA: Diagnosis not present

## 2019-10-19 DIAGNOSIS — H35372 Puckering of macula, left eye: Secondary | ICD-10-CM | POA: Diagnosis not present

## 2019-10-19 DIAGNOSIS — H35033 Hypertensive retinopathy, bilateral: Secondary | ICD-10-CM | POA: Diagnosis not present

## 2019-10-25 DIAGNOSIS — E119 Type 2 diabetes mellitus without complications: Secondary | ICD-10-CM | POA: Diagnosis not present

## 2019-10-25 DIAGNOSIS — E559 Vitamin D deficiency, unspecified: Secondary | ICD-10-CM | POA: Diagnosis not present

## 2019-10-25 DIAGNOSIS — E781 Pure hyperglyceridemia: Secondary | ICD-10-CM | POA: Diagnosis not present

## 2019-10-25 DIAGNOSIS — M109 Gout, unspecified: Secondary | ICD-10-CM | POA: Diagnosis not present

## 2019-12-21 DIAGNOSIS — H34811 Central retinal vein occlusion, right eye, with macular edema: Secondary | ICD-10-CM | POA: Diagnosis not present

## 2019-12-21 DIAGNOSIS — H35373 Puckering of macula, bilateral: Secondary | ICD-10-CM | POA: Diagnosis not present

## 2019-12-21 DIAGNOSIS — H43813 Vitreous degeneration, bilateral: Secondary | ICD-10-CM | POA: Diagnosis not present

## 2019-12-21 DIAGNOSIS — H35033 Hypertensive retinopathy, bilateral: Secondary | ICD-10-CM | POA: Diagnosis not present

## 2019-12-26 ENCOUNTER — Emergency Department (HOSPITAL_COMMUNITY): Payer: Medicare Other

## 2019-12-26 ENCOUNTER — Other Ambulatory Visit: Payer: Self-pay

## 2019-12-26 ENCOUNTER — Emergency Department (HOSPITAL_COMMUNITY)
Admission: EM | Admit: 2019-12-26 | Discharge: 2019-12-26 | Disposition: A | Discharge status: Home / Self Care | Payer: Medicare Other | Attending: Emergency Medicine | Admitting: Emergency Medicine

## 2019-12-26 ENCOUNTER — Encounter (HOSPITAL_COMMUNITY): Payer: Self-pay | Admitting: *Deleted

## 2019-12-26 DIAGNOSIS — R200 Anesthesia of skin: Secondary | ICD-10-CM | POA: Diagnosis not present

## 2019-12-26 DIAGNOSIS — G510 Bell's palsy: Secondary | ICD-10-CM

## 2019-12-26 DIAGNOSIS — E119 Type 2 diabetes mellitus without complications: Secondary | ICD-10-CM | POA: Diagnosis not present

## 2019-12-26 DIAGNOSIS — I10 Essential (primary) hypertension: Secondary | ICD-10-CM | POA: Insufficient documentation

## 2019-12-26 DIAGNOSIS — R29810 Facial weakness: Secondary | ICD-10-CM | POA: Diagnosis not present

## 2019-12-26 LAB — I-STAT CHEM 8, ED
BUN: 9 mg/dL (ref 8–23)
Calcium, Ion: 1.08 mmol/L — ABNORMAL LOW (ref 1.15–1.40)
Chloride: 105 mmol/L (ref 98–111)
Creatinine, Ser: 1 mg/dL (ref 0.61–1.24)
Glucose, Bld: 112 mg/dL — ABNORMAL HIGH (ref 70–99)
HCT: 45 % (ref 39.0–52.0)
Hemoglobin: 15.3 g/dL (ref 13.0–17.0)
Potassium: 3.7 mmol/L (ref 3.5–5.1)
Sodium: 141 mmol/L (ref 135–145)
TCO2: 25 mmol/L (ref 22–32)

## 2019-12-26 LAB — DIFFERENTIAL
Abs Immature Granulocytes: 0.04 10*3/uL (ref 0.00–0.07)
Basophils Absolute: 0.1 10*3/uL (ref 0.0–0.1)
Basophils Relative: 1 %
Eosinophils Absolute: 0.3 10*3/uL (ref 0.0–0.5)
Eosinophils Relative: 4 %
Immature Granulocytes: 1 %
Lymphocytes Relative: 41 %
Lymphs Abs: 3.5 10*3/uL (ref 0.7–4.0)
Monocytes Absolute: 0.6 10*3/uL (ref 0.1–1.0)
Monocytes Relative: 7 %
Neutro Abs: 3.9 10*3/uL (ref 1.7–7.7)
Neutrophils Relative %: 46 %

## 2019-12-26 LAB — COMPREHENSIVE METABOLIC PANEL
ALT: 37 U/L (ref 0–44)
AST: 33 U/L (ref 15–41)
Albumin: 4.1 g/dL (ref 3.5–5.0)
Alkaline Phosphatase: 55 U/L (ref 38–126)
Anion gap: 14 (ref 5–15)
BUN: 8 mg/dL (ref 8–23)
CO2: 24 mmol/L (ref 22–32)
Calcium: 9.7 mg/dL (ref 8.9–10.3)
Chloride: 103 mmol/L (ref 98–111)
Creatinine, Ser: 1.1 mg/dL (ref 0.61–1.24)
GFR calc Af Amer: >60 mL/min (ref >60)
GFR calc non Af Amer: >60 mL/min (ref >60)
Glucose, Bld: 116 mg/dL — ABNORMAL HIGH (ref 70–99)
Potassium: 3.8 mmol/L (ref 3.5–5.1)
Sodium: 141 mmol/L (ref 135–145)
Total Bilirubin: 0.6 mg/dL (ref 0.3–1.2)
Total Protein: 7.3 g/dL (ref 6.5–8.1)

## 2019-12-26 LAB — CBC
HCT: 43.9 % (ref 39.0–52.0)
Hemoglobin: 15 g/dL (ref 13.0–17.0)
MCH: 29.2 pg (ref 26.0–34.0)
MCHC: 34.2 g/dL (ref 30.0–36.0)
MCV: 85.6 fL (ref 80.0–100.0)
Platelets: 293 10*3/uL (ref 150–400)
RBC: 5.13 MIL/uL (ref 4.22–5.81)
RDW: 12.7 % (ref 11.5–15.5)
WBC: 8.5 10*3/uL (ref 4.0–10.5)
nRBC: 0 % (ref 0.0–0.2)

## 2019-12-26 LAB — CBG MONITORING, ED: Glucose-Capillary: 109 mg/dL — ABNORMAL HIGH (ref 70–99)

## 2019-12-26 LAB — PROTIME-INR
INR: 1 (ref 0.8–1.2)
Prothrombin Time: 12.7 seconds (ref 11.4–15.2)

## 2019-12-26 LAB — APTT: aPTT: 29 seconds (ref 24–36)

## 2019-12-26 MED ORDER — PREDNISONE 20 MG PO TABS
60.0000 mg | ORAL_TABLET | Freq: Once | ORAL | Status: AC
  Administered 2019-12-26: 60 mg via ORAL
  Filled 2019-12-26: qty 3

## 2019-12-26 MED ORDER — METHYLPREDNISOLONE SODIUM SUCC 125 MG IJ SOLR: 125.0000 mg | Freq: Once | INTRAMUSCULAR | Status: DC

## 2019-12-26 MED ORDER — MURINE TEARS FOR DRY EYES 5-6 MG/ML OP SOLN: 2.0000 [drp] | OPHTHALMIC | 3 refills | Status: AC | PRN

## 2019-12-26 MED ORDER — SODIUM CHLORIDE 0.9% FLUSH
3.0000 mL | Freq: Once | INTRAVENOUS | Status: AC
  Administered 2019-12-26: 3 mL via INTRAVENOUS

## 2019-12-26 MED ORDER — LORAZEPAM 2 MG/ML IJ SOLN: 1.0000 mg | Freq: Once | INTRAMUSCULAR | Status: DC | PRN

## 2019-12-26 MED ORDER — PREDNISONE 20 MG PO TABS: ORAL_TABLET | ORAL | 0 refills | Status: DC

## 2019-12-26 NOTE — ED Triage Notes (Signed)
The pt has rt facial numbness and he has difficulty  Closing his rt eys rt facial droop since 1600 no gait difficulty  No previous history

## 2019-12-26 NOTE — ED Triage Notes (Signed)
The pt has had numbness rt face and a numbness rt eye and his rt eye does not close close properly   No other symptoms no pain  Steady gait a and o x 4  No previous history

## 2019-12-26 NOTE — ED Notes (Signed)
Off unit, going for MRI

## 2019-12-26 NOTE — ED Provider Notes (Signed)
Emergency Department Provider Note   I have reviewed the triage vital signs and the nursing notes.   HISTORY  Chief Complaint facial droop   HPI Jason Shaffer is a 74 y.o. male   who presents to the emergency department today after noticing facial droop.  Patient states that last time he members look in the mirror and noticed an that he was normal was yesterday afternoon at some point.  He arrived here in the emergency room just before 3:00 in the morning making it at least 10 hours since last known well.  He noticed that he is having trouble closing his right eye and that he could not smile on the right side either.  Never that this before.  No recent viral infections.  No taste changes.  No vision changes.  No weakness numbness tingling in his extremities.  No difficulty walking.  No other associated or modifying symptoms.    Past Medical History:  Diagnosis Date  . Anxiety   . Cataract   . Diabetes mellitus without complication ([place])   . Hypertension     There are no problems to display for this patient.   Past Surgical History:  Procedure Laterality Date  . COLONOSCOPY    . POLYPECTOMY  2004    Current Outpatient Rx  . Order #: [id number] Class: Historical Med  . Order #: [id number] Class: Historical Med  . Order #: [id number] Class: Historical Med  . Order #: [id number] Class: Historical Med  . Order #: [id number] Class: Historical Med  . Order #: [id number] Class: Historical Med  . Order #: [id number] Class: Normal  . Order #: [id number] Class: Normal    Allergies Patient has no known allergies.  Family History  Problem Relation Age of Onset  . Liver cancer Mother   . Pancreatic cancer Brother   . Breast cancer Paternal Grandmother   . Colon cancer Neg Hx   . Colon polyps Neg Hx   . Esophageal cancer Neg Hx   . Ovarian cancer Neg Hx   . Rectal cancer Neg Hx   . Stomach cancer Neg Hx     Social History Social History   Tobacco Use  . Smoking status: Never  Smoker  . Smokeless tobacco: Never Used  Substance Use Topics  . Alcohol use: Not Currently  . Drug use: Never    Review of Systems  All other systems negative except as documented in the HPI. All pertinent positives and negatives as reviewed in the HPI. ____________________________________________   PHYSICAL EXAM:  VITAL SIGNS: ED Triage Vitals  Enc Vitals Group     BP 12/26/19 0240 (!) 153/97     Pulse Rate 12/26/19 0240 84     Resp 12/26/19 0240 16     Temp 12/26/19 0240 98 F (36.7 C)     Temp Source 12/26/19 0240 Oral     SpO2 12/26/19 0240 98 %     Weight 12/26/19 0248 228 lb (103.4 kg)     Height 12/26/19 0248 5\' 10"  (1.778 m)    Constitutional: Alert and oriented. Well appearing and in no acute distress. Eyes: Conjunctivae are normal. PERRL. EOMI. Head: Atraumatic. Nose: No congestion/rhinnorhea. Mouth/Throat: Mucous membranes are moist.  Oropharynx non-erythematous. Neck: No stridor.  No meningeal signs.   Cardiovascular: Normal rate, regular rhythm. Good peripheral circulation. Grossly normal heart sounds.   Respiratory: Normal respiratory effort.  No retractions. Lungs CTAB. Gastrointestinal: Soft and nontender. No distention.  Musculoskeletal: No lower extremity tenderness nor edema. No gross deformities of extremities. Neurologic:  Patient has decreased ability to raise his right eyebrow, close his right eyelid compared to the left.  He has a significant right nasolabial fold flattening and droop to his right side of his face.  When I pour sugar on his tongue he states he can taste it throughout his whole tongue.  Hearing is intact.  Extraocular movements are intact.  Pupils are intact.  He has normal and symmetric strength bilateral hands grip, tricep, bicep and shoulder shrug.  Bilateral and symmetric dorsiflexion and plantarflexion of feet.  Has bilateral symmetric sensation to upper and lower extremities to light touch. Skin:  Skin is warm, dry and intact. No  rash noted.  ____________________________________________   LABS (all labs ordered are listed, but only abnormal results are displayed)  Labs Reviewed  COMPREHENSIVE METABOLIC PANEL - Abnormal; Notable for the following components:      Result Value   Glucose, Bld 116 (*)    All other components within normal limits  I-STAT CHEM 8, ED - Abnormal; Notable for the following components:   Glucose, Bld 112 (*)    Calcium, Ion 1.08 (*)    All other components within normal limits  CBG MONITORING, ED - Abnormal; Notable for the following components:   Glucose-Capillary 109 (*)    All other components within normal limits  PROTIME-INR  APTT  CBC  DIFFERENTIAL   ____________________________________________  EKG   EKG Interpretation  Date/Time:    Ventricular Rate:    PR Interval:    QRS Duration:   QT Interval:    QTC Calculation:   R Axis:     Text Interpretation:         ____________________________________________  RADIOLOGY  CT HEAD WO CONTRAST  Result Date: 12/26/2019 CLINICAL DATA:  Right facial droop EXAM: CT HEAD WITHOUT CONTRAST TECHNIQUE: Contiguous axial images were obtained from the base of the skull through the vertex without intravenous contrast. COMPARISON:  None. FINDINGS: Brain: There is atrophy and chronic small vessel disease changes. No acute intracranial abnormality. Specifically, no hemorrhage, hydrocephalus, mass lesion, acute infarction, or significant intracranial injury. Dense dural calcifications. Vascular: No hyperdense vessel or unexpected calcification. Skull: No acute calvarial abnormality. Sinuses/Orbits: Visualized paranasal sinuses and mastoids clear. Orbital soft tissues unremarkable. Other: None IMPRESSION: Atrophy, chronic microvascular disease. No acute intracranial abnormality. Electronically Signed   By: Rolm Baptise M.D.   On: 12/26/2019 03:28   MR BRAIN WO CONTRAST  Result Date: 12/26/2019 CLINICAL DATA:  Right-sided facial  numbness with facial droop and difficulty closing I since 12/25/2019 EXAM: MRI HEAD WITHOUT CONTRAST TECHNIQUE: Multiplanar, multiecho pulse sequences of the brain and surrounding structures were obtained without intravenous contrast. COMPARISON:  Head CT from earlier today FINDINGS: Brain: No acute infarction, hemorrhage, hydrocephalus, extra-axial collection or mass lesion. Incidental ossification along the falx. Mild cerebral volume loss and chronic white matter changes for age. Mild chronic small vessel ischemic type change in the pons. No specific finding in the brainstem, cisterns, or skull base to explain symptoms. Vascular: Normal flow voids. Skull and upper cervical spine: Normal marrow signal. Sinuses/Orbits: Negative. Other: None. IMPRESSION: Senescent changes without acute finding or specific cause of symptoms. Electronically Signed   By: Monte Fantasia M.D.   On: 12/26/2019 06:25    ____________________________________________   PROCEDURES  Procedure(s) performed:   Procedures   ____________________________________________   INITIAL IMPRESSION / ASSESSMENT AND PLAN / ED COURSE  Even if this is an acute stroke he is well outside the window for any treatment.  He is not LVO positive.  Likely bells palsy however with intact taste makes it a little bit more unclear.  Will MRI to ensure no acute ischemia.   Workup not c/w cva, treated for bells palsy. No new symptoms, minor improvement in facial symptoms prior to discharge. Able to fully close right eyelid but opens with ease. Will observe at home to ensure fully closed while sleeping, eyedrops prescribed.      Pertinent labs & imaging results that were available during my care of the patient were reviewed by me and considered in my medical decision making (see chart for details).  A medical screening exam was performed and I feel the patient has had an appropriate workup for their chief complaint at this time and likelihood of  emergent condition existing is low. They have been counseled on decision, discharge, follow up and which symptoms necessitate immediate return to the emergency department. They or their family verbally stated understanding and agreement with plan and discharged in stable condition.   ____________________________________________  FINAL CLINICAL IMPRESSION(S) / ED DIAGNOSES  Final diagnoses:  Bell's palsy     MEDICATIONS GIVEN DURING THIS VISIT:  Medications  sodium chloride flush (NS) 0.9 % injection 3 mL (3 mLs Intravenous Given 12/26/19 0343)  predniSONE (DELTASONE) tablet 60 mg (60 mg Oral Given 12/26/19 0657)     NEW OUTPATIENT MEDICATIONS STARTED DURING THIS VISIT:  Discharge Medication List as of 12/26/2019  6:36 AM    START taking these medications   Details  Polyvinyl Alcohol-Povidone (MURINE TEARS FOR DRY EYES) 5-6 MG/ML SOLN Apply 2 drops to eye as needed (dry eyes)., Starting Mon 12/26/2019, Normal    predniSONE (DELTASONE) 20 MG tablet 3 tabs po daily x 3 days, then 2 tabs x 3 days, then 1.5 tabs x 3 days, then 1 tab x 3 days, then 0.5 tabs x 3 days, Normal        Note:  This note was prepared with assistance of Dragon voice recognition software. Occasional wrong-word or sound-a-like substitutions may have occurred due to the inherent limitations of voice recognition software.   Merrily Pew, MD 12/26/19 [phone]

## 2020-01-25 DIAGNOSIS — H34811 Central retinal vein occlusion, right eye, with macular edema: Secondary | ICD-10-CM | POA: Diagnosis not present

## 2020-02-07 ENCOUNTER — Encounter: Payer: Self-pay | Admitting: Neurology

## 2020-02-07 ENCOUNTER — Ambulatory Visit: Payer: Medicare Other | Admitting: Neurology

## 2020-02-07 ENCOUNTER — Other Ambulatory Visit: Payer: Self-pay

## 2020-02-07 VITALS — BP 135/82 | HR 81 | Temp 97.5°F | Ht 70.0 in | Wt 235.0 lb

## 2020-02-07 DIAGNOSIS — G510 Bell's palsy: Secondary | ICD-10-CM | POA: Diagnosis not present

## 2020-02-07 NOTE — Progress Notes (Signed)
PATIENT: Jason Shaffer DOB: November 27, 1946  Chief Complaint  Patient presents with  . Bell's Palsy    He is here for right-sided Bell's Palsy. Seen in ED on 12/26/19. He took 15 days of Prednisone. His symptoms have improved other than still having a slight issue on the right side of his mouth. Says he really only notices it when he is playing trumpet. No issues with eating or drinking.   Marland Kitchen PCP    Janie Morning, DO - referred from hospital     HISTORICAL  Jason Shaffer is a 74 year old male, seen in request by his primary care physician Dr. Theda Sers, [place] for evaluation of right facial weakness, initial evaluation was on February 08, 2020.  I have reviewed and summarized the referring note from the referring physician.  He had a past medical history of diabetes, hypertension, woke up on December 26, 2019 noticed right lower face weakness, when he looked at himself in the mirror, he noticed significant asymmetry of his face, he cannot close his right eye.  He did complains of mild right lateral orbital area pain for few days prior to symptom onset, but he denies hearing loss, no sensory loss, no rash broke out.  He presented to emergency room concerning for stroke, I personally reviewed MRI of the brain without contrast, no evidence of stroke, mild generalized atrophy, mild supratentorium small vessel disease  Labs, CMP, creat 1.08, normal CBC  He was treated with prednisone and Valtrex, his symptoms had a significant improvement over the following 2 weeks, he can close his right eye now, continue has mild right face weakness, he used to play trumpet.  REVIEW OF SYSTEMS: Full 14 system review of systems performed and notable only for as above All other review of systems were negative.  ALLERGIES: No Known Allergies  HOME MEDICATIONS: Current Outpatient Medications  Medication Sig Dispense Refill  . amLODipine (NORVASC) 10 MG tablet Take 10 mg by mouth daily.  2  . atenolol (TENORMIN) 50 MG  tablet Take 50 mg by mouth at bedtime.   2  . cholecalciferol (VITAMIN D) 1000 units tablet Take 4,000 Units by mouth daily.     Marland Kitchen glyBURIDE (DIABETA) 5 MG tablet Take 5 mg by mouth daily with breakfast.     . metFORMIN (GLUCOPHAGE) 1000 MG tablet Take 1,000 mg by mouth 2 (two) times daily with a meal.    . PARoxetine (PAXIL) 30 MG tablet Take 30 mg by mouth at bedtime.     . Polyvinyl Alcohol-Povidone (MURINE TEARS FOR DRY EYES) 5-6 MG/ML SOLN Apply 2 drops to eye as needed (dry eyes). 30 mL 3   No current facility-administered medications for this visit.    PAST MEDICAL HISTORY: Past Medical History:  Diagnosis Date  . Anxiety   . Bell's palsy    right  . Cataract   . Diabetes mellitus without complication ([place])   . Hypertension     PAST SURGICAL HISTORY: Past Surgical History:  Procedure Laterality Date  . COLONOSCOPY    . POLYPECTOMY  2004    FAMILY HISTORY: Family History  Problem Relation Age of Onset  . Liver cancer Mother   . Heart attack Father   . Pancreatic cancer Brother   . Breast cancer Paternal Grandmother   . Colon cancer Neg Hx   . Colon polyps Neg Hx   . Esophageal cancer Neg Hx   . Ovarian cancer Neg Hx   . Rectal cancer Neg Hx   .  Stomach cancer Neg Hx     SOCIAL HISTORY: Social History   Socioeconomic History  . Marital status: Married    Spouse name: Not on file  . Number of children: 3  . Years of education: 14  . Highest education level: High school graduate  Occupational History  . Occupation: Retired  Tobacco Use  . Smoking status: Never Smoker  . Smokeless tobacco: Never Used  Substance and Sexual Activity  . Alcohol use: Not Currently  . Drug use: Never  . Sexual activity: Not on file  Other Topics Concern  . Not on file  Social History Narrative   Lives at home with his wife.   Right-handed.   One cup caffeine per day.   Social Determinants of Health   Financial Resource Strain:   . Difficulty of Paying Living  Expenses: Not on file  Food Insecurity:   . Worried About Charity fundraiser in the Last Year: Not on file  . Ran Out of Food in the Last Year: Not on file  Transportation Needs:   . Lack of Transportation (Medical): Not on file  . Lack of Transportation (Non-Medical): Not on file  Physical Activity:   . Days of Exercise per Week: Not on file  . Minutes of Exercise per Session: Not on file  Stress:   . Feeling of Stress : Not on file  Social Connections:   . Frequency of Communication with Friends and Family: Not on file  . Frequency of Social Gatherings with Friends and Family: Not on file  . Attends Religious Services: Not on file  . Active Member of Clubs or Organizations: Not on file  . Attends Archivist Meetings: Not on file  . Marital Status: Not on file  Intimate Partner Violence:   . Fear of Current or Ex-Partner: Not on file  . Emotionally Abused: Not on file  . Physically Abused: Not on file  . Sexually Abused: Not on file     PHYSICAL EXAM   Vitals:   02/07/20 1021  BP: 135/82  Pulse: 81  Temp: (!) 97.5 F (36.4 C)  Weight: 235 lb (106.6 kg)  Height: 5\' 10"  (1.778 m)    Not recorded      Body mass index is 33.72 kg/m.  PHYSICAL EXAMNIATION:  Gen: NAD, conversant, well nourised, well groomed                     Cardiovascular: Regular rate rhythm, no peripheral edema, warm, nontender. Eyes: Conjunctivae clear without exudates or hemorrhage Neck: Supple, no carotid bruits. Pulmonary: Clear to auscultation bilaterally   NEUROLOGICAL EXAM:  MENTAL STATUS: Speech:    Speech is normal; fluent and spontaneous with normal comprehension.  Cognition:     Orientation to time, place and person     Normal recent and remote memory     Normal Attention span and concentration     Normal Language, naming, repeating,spontaneous speech     Fund of knowledge   CRANIAL NERVES: CN II: Visual fields are full to confrontation. Pupils are round equal  and briskly reactive to light. CN III, IV, VI: extraocular movement are normal. No ptosis. CN V: Facial sensation is intact to light touch bilateral corneal reflexes were symmetric. CN VII: He has mild right eye closure weakness, mild right cheek puff weakness. CN VIII: Hearing is normal to causal conversation. CN IX, X: Phonation is normal. CN XI: Head turning and shoulder shrug are intact  MOTOR: There is no pronator drift of out-stretched arms. Muscle bulk and tone are normal. Muscle strength is normal.  REFLEXES: Reflexes are 2+ and symmetric at the biceps, triceps, knees, and ankles. Plantar responses are flexor.  SENSORY: Intact to light touch, pinprick and vibratory sensation are intact in fingers and toes.  COORDINATION: There is no trunk or limb dysmetria noted.  GAIT/STANCE: Posture is normal. Gait is steady with normal steps, base, arm swing, and turning.   DIAGNOSTIC DATA (LABS, IMAGING, TESTING) - I reviewed patient records, labs, notes, testing and imaging myself where available.   ASSESSMENT AND PLAN  Jason Shaffer is a 74 y.o. male   Right Bell's palsy, started on December 26, 2019,  Already had significant improvement,  I am expecting he continue to improve to nearly baseline level.  Marcial Pacas, M.D. Ph.D.  [facility] [address] [place], [place] [address] Ph: [phone] Fax: [phone]  CC: Janie Morning, DO

## 2020-03-05 DIAGNOSIS — I10 Essential (primary) hypertension: Secondary | ICD-10-CM | POA: Diagnosis not present

## 2020-03-05 DIAGNOSIS — M109 Gout, unspecified: Secondary | ICD-10-CM | POA: Diagnosis not present

## 2020-03-05 DIAGNOSIS — E781 Pure hyperglyceridemia: Secondary | ICD-10-CM | POA: Diagnosis not present

## 2020-03-05 DIAGNOSIS — E119 Type 2 diabetes mellitus without complications: Secondary | ICD-10-CM | POA: Diagnosis not present

## 2020-03-05 DIAGNOSIS — E559 Vitamin D deficiency, unspecified: Secondary | ICD-10-CM | POA: Diagnosis not present

## 2020-03-12 DIAGNOSIS — R400 Somnolence: Secondary | ICD-10-CM | POA: Diagnosis not present

## 2020-03-12 DIAGNOSIS — E119 Type 2 diabetes mellitus without complications: Secondary | ICD-10-CM | POA: Diagnosis not present

## 2020-03-12 DIAGNOSIS — R0683 Snoring: Secondary | ICD-10-CM | POA: Diagnosis not present

## 2020-03-12 DIAGNOSIS — M109 Gout, unspecified: Secondary | ICD-10-CM | POA: Diagnosis not present

## 2020-03-13 ENCOUNTER — Encounter: Payer: Self-pay | Admitting: Gastroenterology

## 2020-03-27 DIAGNOSIS — H2512 Age-related nuclear cataract, left eye: Secondary | ICD-10-CM | POA: Diagnosis not present

## 2020-03-27 DIAGNOSIS — H524 Presbyopia: Secondary | ICD-10-CM | POA: Diagnosis not present

## 2020-03-29 DIAGNOSIS — H43813 Vitreous degeneration, bilateral: Secondary | ICD-10-CM | POA: Diagnosis not present

## 2020-03-29 DIAGNOSIS — H35373 Puckering of macula, bilateral: Secondary | ICD-10-CM | POA: Diagnosis not present

## 2020-03-29 DIAGNOSIS — H35033 Hypertensive retinopathy, bilateral: Secondary | ICD-10-CM | POA: Diagnosis not present

## 2020-03-29 DIAGNOSIS — H34811 Central retinal vein occlusion, right eye, with macular edema: Secondary | ICD-10-CM | POA: Diagnosis not present

## 2020-04-04 DIAGNOSIS — G4733 Obstructive sleep apnea (adult) (pediatric): Secondary | ICD-10-CM | POA: Diagnosis not present

## 2020-04-06 DIAGNOSIS — G4733 Obstructive sleep apnea (adult) (pediatric): Secondary | ICD-10-CM | POA: Diagnosis not present

## 2020-04-17 ENCOUNTER — Ambulatory Visit (AMBULATORY_SURGERY_CENTER): Payer: Self-pay | Admitting: *Deleted

## 2020-04-17 ENCOUNTER — Other Ambulatory Visit: Payer: Self-pay

## 2020-04-17 VITALS — Temp 96.8°F | Ht 70.0 in | Wt 238.0 lb

## 2020-04-17 DIAGNOSIS — Z86010 Personal history of colonic polyps: Secondary | ICD-10-CM

## 2020-04-17 NOTE — Progress Notes (Signed)
Patient is here in-person for PV. Patient denies any allergies to eggs or soy. Patient denies any problems with anesthesia/sedation. Patient denies any oxygen use at home. Patient denies taking any diet/weight loss medications or blood thinners. Patient is not being treated for MRSA or C-diff. Patient is aware of our care-partner policy and [id number] safety protocol.

## 2020-04-19 ENCOUNTER — Encounter: Payer: Self-pay | Admitting: Gastroenterology

## 2020-05-01 ENCOUNTER — Ambulatory Visit (AMBULATORY_SURGERY_CENTER): Payer: Medicare Other | Admitting: Gastroenterology

## 2020-05-01 ENCOUNTER — Encounter: Payer: Self-pay | Admitting: Gastroenterology

## 2020-05-01 ENCOUNTER — Other Ambulatory Visit: Payer: Self-pay

## 2020-05-01 VITALS — BP 140/81 | HR 65 | Temp 96.9°F | Resp 17 | Ht 70.0 in | Wt 238.0 lb

## 2020-05-01 DIAGNOSIS — D128 Benign neoplasm of rectum: Secondary | ICD-10-CM | POA: Diagnosis not present

## 2020-05-01 DIAGNOSIS — D124 Benign neoplasm of descending colon: Secondary | ICD-10-CM

## 2020-05-01 DIAGNOSIS — Z86010 Personal history of colonic polyps: Secondary | ICD-10-CM | POA: Diagnosis not present

## 2020-05-01 DIAGNOSIS — Z1211 Encounter for screening for malignant neoplasm of colon: Secondary | ICD-10-CM | POA: Diagnosis not present

## 2020-05-01 DIAGNOSIS — D123 Benign neoplasm of transverse colon: Secondary | ICD-10-CM | POA: Diagnosis not present

## 2020-05-01 DIAGNOSIS — D122 Benign neoplasm of ascending colon: Secondary | ICD-10-CM

## 2020-05-01 MED ORDER — SODIUM CHLORIDE 0.9 % IV SOLN: 500.0000 mL | Freq: Once | INTRAVENOUS | Status: DC

## 2020-05-01 NOTE — Op Note (Signed)
Jason Shaffer Procedure Date: 05/01/2020 8:38 AM MRN: [id number] Endoscopist: Mallie Mussel L. Loletha Carrow , MD Age: 74 Referring MD:  Date of Birth: 02/08/1946 Gender: Male Account #: [id number] Procedure:                Colonoscopy Indications:              Surveillance: Piecemeal removal of large sessile                            adenoma last colonoscopy (< 3 yrs) - 54mm sessile                            TA proximal AC and add'l left colon TA 05/2018 Medicines:                Monitored Anesthesia Care Procedure:                Pre-Anesthesia Assessment:                           - Prior to the procedure, a History and Physical                            was performed, and patient medications and                            allergies were reviewed. The patient's tolerance of                            previous anesthesia was also reviewed. The risks                            and benefits of the procedure and the sedation                            options and risks were discussed with the patient.                            All questions were answered, and informed consent                            was obtained. Prior Anticoagulants: The patient has                            taken no previous anticoagulant or antiplatelet                            agents. ASA Grade Assessment: III - A patient with                            severe systemic disease. After reviewing the risks                            and benefits, the patient was deemed in  satisfactory condition to undergo the procedure.                           After obtaining informed consent, the colonoscope                            was passed under direct vision. Throughout the                            procedure, the patient's blood pressure, pulse, and                            oxygen saturations were monitored continuously. The                            Colonoscope was  introduced through the anus and                            advanced to the the cecum, identified by                            appendiceal orifice and ileocecal valve. The                            colonoscopy was somewhat difficult due to a                            redundant colon, significant looping and a tortuous                            colon. Successful completion of the procedure was                            aided by changing the patient to a semi-supine                            position and using manual pressure. The patient                            tolerated the procedure well. The quality of the                            bowel preparation was initially fair, then improved                            to good with lavage. The ileocecal valve,                            appendiceal orifice, and rectum were photographed.                            The bowel preparation used was Miralax. Scope In: 8:41:03 AM Scope Out: 9:08:36 AM Scope Withdrawal Time: 0 hours 17 minutes 48 seconds  Total  Procedure Duration: 0 hours 27 minutes 33 seconds  Findings:                 The perianal and digital rectal examinations were                            normal.                           Three sessile polyps were found in the rectum,                            proximal ascending colon and mid ascending colon.                            The polyps were 2 to 6 mm in size. These polyps                            were removed with a cold snare. Resection and                            retrieval were complete.                           Four sessile polyps were found in the proximal                            descending colon, proximal transverse colon and mid                            transverse colon. The polyps were 2 to 6 mm in                            size. These polyps were removed with a cold snare.                            Resection and retrieval were complete.                            Multiple diverticula were found in the left colon.                           The exam was otherwise without abnormality on                            direct and retroflexion views. Complications:            No immediate complications. Estimated Blood Loss:     Estimated blood loss was minimal. Impression:               - Three 2 to 6 mm polyps in the rectum, in the                            proximal ascending colon and in the mid ascending  colon, removed with a cold snare. Resected and                            retrieved.                           - Four 2 to 6 mm polyps in the proximal descending                            colon, in the proximal transverse colon and in the                            mid transverse colon, removed with a cold snare.                            Resected and retrieved.                           - Diverticulosis in the left colon.                           - The examination was otherwise normal on direct                            and retroflexion views. Recommendation:           - Patient has a contact number available for                            emergencies. The signs and symptoms of potential                            delayed complications were discussed with the                            patient. Return to normal activities tomorrow.                            Written discharge instructions were provided to the                            patient.                           - Resume previous diet.                           - Continue present medications.                           - Await pathology results.                           - Repeat colonoscopy in 2 years for surveillance.                            (  2-day prep for next colonoscopy) Mallie Mussel L. Loletha Carrow, MD 05/01/2020 9:17:51 AM This report has been signed electronically.

## 2020-05-01 NOTE — Progress Notes (Signed)
A and O x3. Report to RN. Tolerated MAC anesthesia well.

## 2020-05-01 NOTE — Patient Instructions (Signed)
Discharge instructions given. Handouts on polyps and diverticulosis. Resume previous medications. YOU HAD AN ENDOSCOPIC PROCEDURE TODAY AT THE [facility]:   Refer to the procedure report that was given to you for any specific questions about what was found during the examination.  If the procedure report does not answer your questions, please call your gastroenterologist to clarify.  If you requested that your care partner not be given the details of your procedure findings, then the procedure report has been included in a sealed envelope for you to review at your convenience later.  YOU SHOULD EXPECT: Some feelings of bloating in the abdomen. Passage of more gas than usual.  Walking can help get rid of the air that was put into your GI tract during the procedure and reduce the bloating. If you had a lower endoscopy (such as a colonoscopy or flexible sigmoidoscopy) you may notice spotting of blood in your stool or on the toilet paper. If you underwent a bowel prep for your procedure, you may not have a normal bowel movement for a few days.  Please Note:  You might notice some irritation and congestion in your nose or some drainage.  This is from the oxygen used during your procedure.  There is no need for concern and it should clear up in a day or so.  SYMPTOMS TO REPORT IMMEDIATELY:   Following lower endoscopy (colonoscopy or flexible sigmoidoscopy):  Excessive amounts of blood in the stool  Significant tenderness or worsening of abdominal pains  Swelling of the abdomen that is new, acute  Fever of 100F or higher   For urgent or emergent issues, a gastroenterologist can be reached at any hour by calling [phone]. Do not use MyChart messaging for urgent concerns.    DIET:  We do recommend a small meal at first, but then you may proceed to your regular diet.  Drink plenty of fluids but you should avoid alcoholic beverages for 24 hours.  ACTIVITY:  You should plan to take  it easy for the rest of today and you should NOT DRIVE or use heavy machinery until tomorrow (because of the sedation medicines used during the test).    FOLLOW UP: Our staff will call the number listed on your records 48-72 hours following your procedure to check on you and address any questions or concerns that you may have regarding the information given to you following your procedure. If we do not reach you, we will leave a message.  We will attempt to reach you two times.  During this call, we will ask if you have developed any symptoms of COVID 19. If you develop any symptoms (ie: fever, flu-like symptoms, shortness of breath, cough etc.) before then, please call [phone].  If you test positive for Covid 19 in the 2 weeks post procedure, please call and report this information to us.    If any biopsies were taken you will be contacted by phone or by letter within the next 1-3 weeks.  Please call us at [phone] if you have not heard about the biopsies in 3 weeks.    SIGNATURES/CONFIDENTIALITY: You and/or your care partner have signed paperwork which will be entered into your electronic medical record.  These signatures attest to the fact that that the information above on your After Visit Summary has been reviewed and is understood.  Full responsibility of the confidentiality of this discharge information lies with you and/or your care-partner. 

## 2020-05-01 NOTE — Progress Notes (Signed)
VS taken by DT   Pt's states no medical or surgical changes since previsit or office visit.

## 2020-05-02 DIAGNOSIS — H34811 Central retinal vein occlusion, right eye, with macular edema: Secondary | ICD-10-CM | POA: Diagnosis not present

## 2020-05-03 ENCOUNTER — Telehealth: Payer: Self-pay

## 2020-05-03 ENCOUNTER — Telehealth: Payer: Self-pay | Admitting: *Deleted

## 2020-05-03 NOTE — Telephone Encounter (Signed)
Second follow up call attempt.  Sounded like phone was picked up but nobody said anything.

## 2020-05-03 NOTE — Telephone Encounter (Signed)
Left message on follow up call. 

## 2020-05-03 NOTE — Telephone Encounter (Signed)
Returning our call. Tells me he is doing very well after his procedure. Advised to reach out to [place] if he has any problems.

## 2020-05-04 ENCOUNTER — Encounter: Payer: Self-pay | Admitting: Gastroenterology

## 2020-07-09 DIAGNOSIS — H43813 Vitreous degeneration, bilateral: Secondary | ICD-10-CM | POA: Diagnosis not present

## 2020-07-09 DIAGNOSIS — H35373 Puckering of macula, bilateral: Secondary | ICD-10-CM | POA: Diagnosis not present

## 2020-07-09 DIAGNOSIS — H34811 Central retinal vein occlusion, right eye, with macular edema: Secondary | ICD-10-CM | POA: Diagnosis not present

## 2020-07-09 DIAGNOSIS — H43822 Vitreomacular adhesion, left eye: Secondary | ICD-10-CM | POA: Diagnosis not present

## 2020-08-01 DIAGNOSIS — R310 Gross hematuria: Secondary | ICD-10-CM | POA: Diagnosis not present

## 2020-08-08 DIAGNOSIS — H34811 Central retinal vein occlusion, right eye, with macular edema: Secondary | ICD-10-CM | POA: Diagnosis not present

## 2020-08-08 DIAGNOSIS — H35373 Puckering of macula, bilateral: Secondary | ICD-10-CM | POA: Diagnosis not present

## 2020-09-20 DIAGNOSIS — R946 Abnormal results of thyroid function studies: Secondary | ICD-10-CM | POA: Diagnosis not present

## 2020-09-20 DIAGNOSIS — R400 Somnolence: Secondary | ICD-10-CM | POA: Diagnosis not present

## 2020-09-20 DIAGNOSIS — E119 Type 2 diabetes mellitus without complications: Secondary | ICD-10-CM | POA: Diagnosis not present

## 2020-09-20 DIAGNOSIS — E781 Pure hyperglyceridemia: Secondary | ICD-10-CM | POA: Diagnosis not present

## 2020-09-20 DIAGNOSIS — Z0000 Encounter for general adult medical examination without abnormal findings: Secondary | ICD-10-CM | POA: Diagnosis not present

## 2020-09-20 DIAGNOSIS — E559 Vitamin D deficiency, unspecified: Secondary | ICD-10-CM | POA: Diagnosis not present

## 2020-09-27 DIAGNOSIS — Z0000 Encounter for general adult medical examination without abnormal findings: Secondary | ICD-10-CM | POA: Diagnosis not present

## 2020-09-27 DIAGNOSIS — G4733 Obstructive sleep apnea (adult) (pediatric): Secondary | ICD-10-CM | POA: Diagnosis not present

## 2020-09-27 DIAGNOSIS — E119 Type 2 diabetes mellitus without complications: Secondary | ICD-10-CM | POA: Diagnosis not present

## 2020-09-27 DIAGNOSIS — M109 Gout, unspecified: Secondary | ICD-10-CM | POA: Diagnosis not present

## 2020-09-27 DIAGNOSIS — Z23 Encounter for immunization: Secondary | ICD-10-CM | POA: Diagnosis not present

## 2020-10-17 DIAGNOSIS — H43813 Vitreous degeneration, bilateral: Secondary | ICD-10-CM | POA: Diagnosis not present

## 2020-10-17 DIAGNOSIS — H34811 Central retinal vein occlusion, right eye, with macular edema: Secondary | ICD-10-CM | POA: Diagnosis not present

## 2020-10-17 DIAGNOSIS — H35373 Puckering of macula, bilateral: Secondary | ICD-10-CM | POA: Diagnosis not present

## 2020-10-17 DIAGNOSIS — H43822 Vitreomacular adhesion, left eye: Secondary | ICD-10-CM | POA: Diagnosis not present

## 2020-11-05 IMAGING — MR MR HEAD W/O CM
10 of 11 series · 43 of 48 positions shown · non-contrast
Comparison: Head CT from earlier today

CLINICAL DATA: Right-sided facial numbness with facial droop and
difficulty closing I since 12/25/2019

EXAM:
MRI HEAD WITHOUT CONTRAST
TECHNIQUE: Multiplanar, multiecho pulse sequences of the brain and surrounding
structures were obtained without intravenous contrast.

[Series 5: DWI · axial · 3.0mm · 0.88mm/px · z∈[-97,+42]mm · 10 of 100 slices shown (1 of 4)]
[im 1/100]
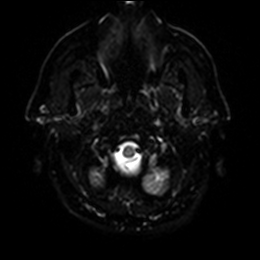
[im 12/100]
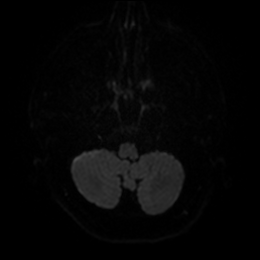
[im 23/100]
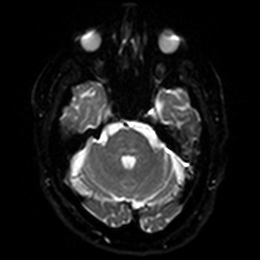
[im 34/100]
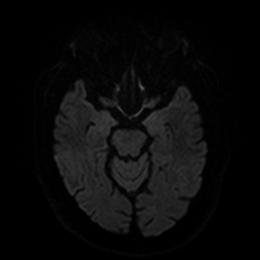
[im 45/100]
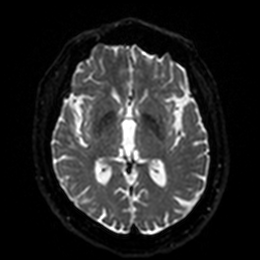
[im 56/100]
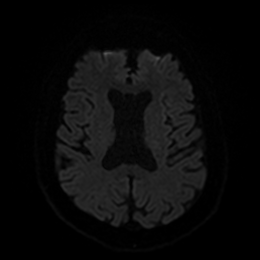
[im 67/100]
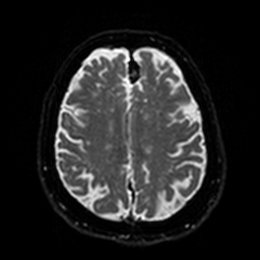
[im 78/100]
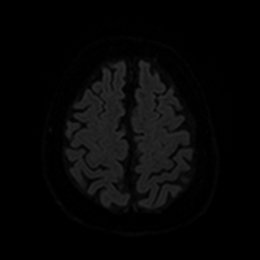
[im 89/100]
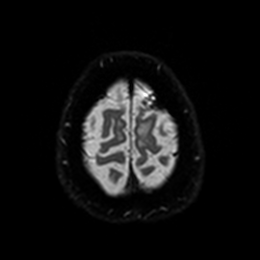
[im 100/100]
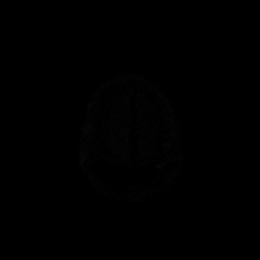

[Series 6: DWI · axial · 3.0mm · 0.88mm/px · z∈[-97,+42]mm · 5 of 50 slices shown (2 of 4)]
[im 1/50]
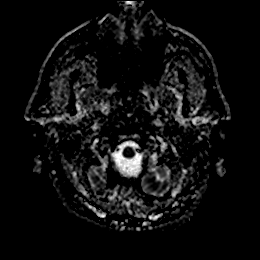
[im 13/50]
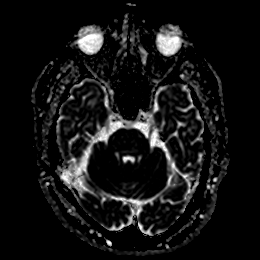
[im 25/50]
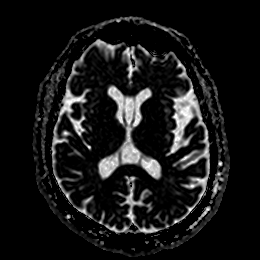
[im 37/50]
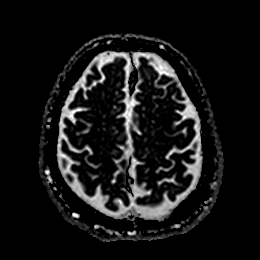
[im 50/50]
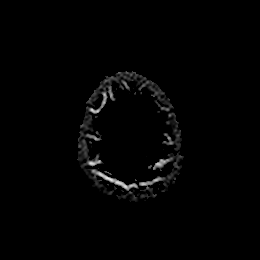

[Series 7: DWI · coronal · 4.0mm · 0.88mm/px · 6 of 71 slices shown (3 of 4)]
[im 1/71]
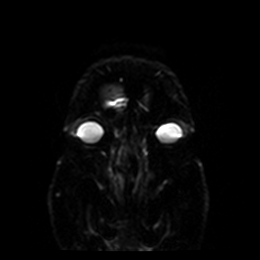
[im 15/71]
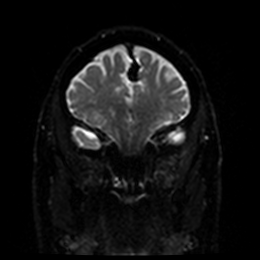
[im 29/71]
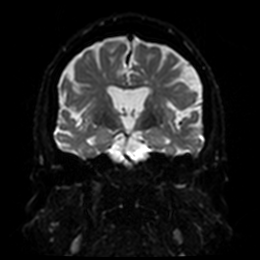
[im 43/71]
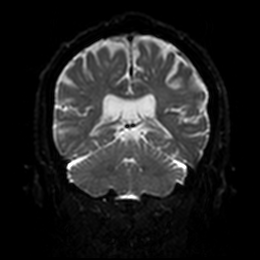
[im 57/71]
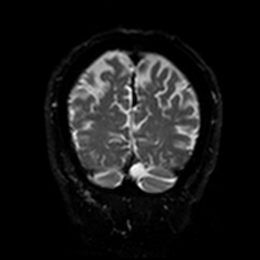
[im 71/71]
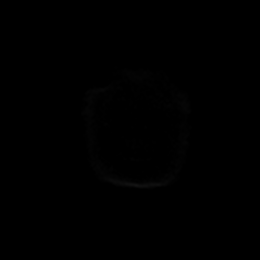

[Series 8: DWI · coronal · 4.0mm · 0.88mm/px · 3 of 36 slices shown (4 of 4)]
[im 1/36]
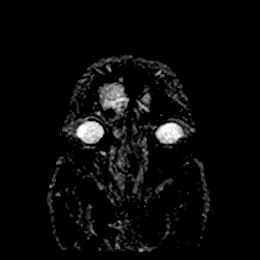
[im 18/36]
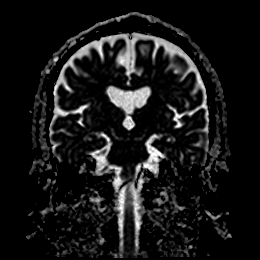
[im 36/36]
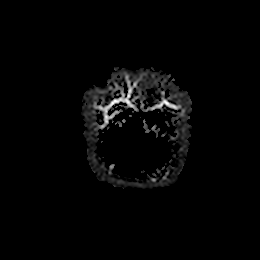

[Series 9: T1 · sagittal · 5.0mm · 0.75mm/px · 2 of 25 slices shown]
[im 1/25]
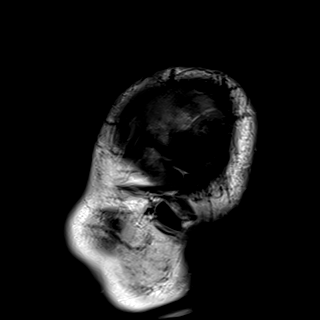
[im 25/25]
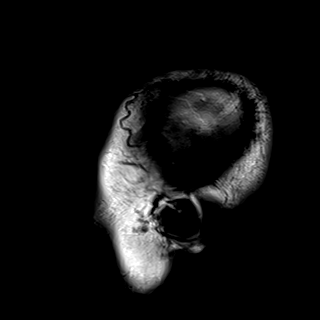

[Series 10: T2 · axial · 5.0mm · 0.72mm/px · z∈[-99,+50]mm · 2 of 27 slices shown (1 of 2)]
[im 1/27]
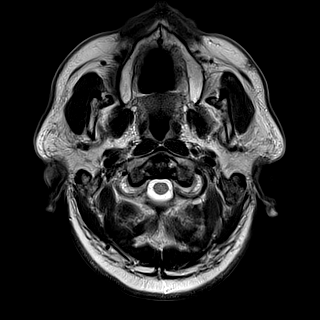
[im 27/27]
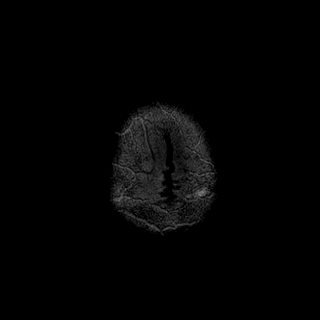

[Series 11: FLAIR · axial · 5.0mm · 0.45mm/px · z∈[-99,+51]mm · 2 of 27 slices shown]
[im 1/27]
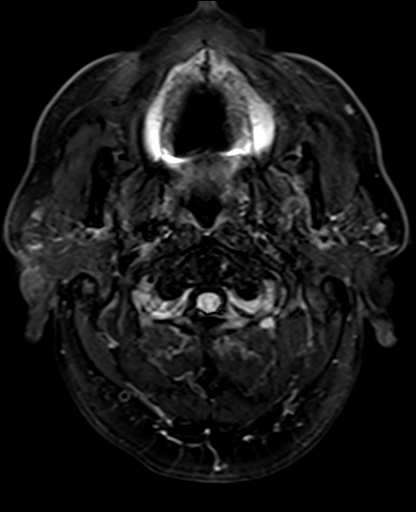
[im 27/27]
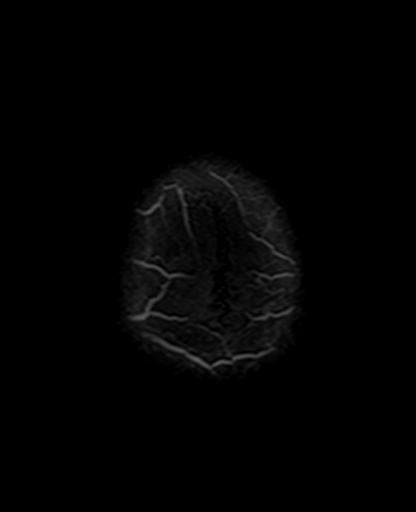

[Series 13: pha_images · axial · 3.0mm · 0.90mm/px · z∈[-103,+58]mm · 5 of 55 slices shown]
[im 1/55]
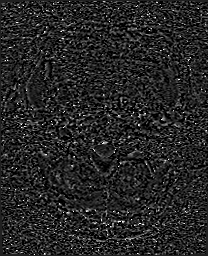
[im 14/55]
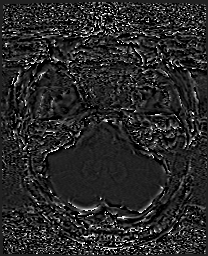
[im 28/55]
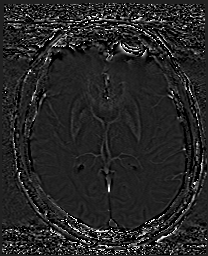
[im 41/55]
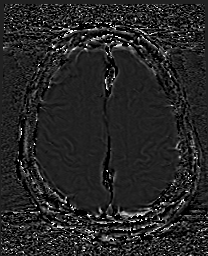
[im 55/55]
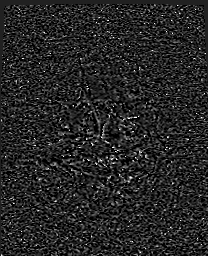

[Series 14: swi_images · axial · 3.0mm · 0.90mm/px · z∈[-103,+66]mm · 5 of 60 slices shown]
[im 1/60]
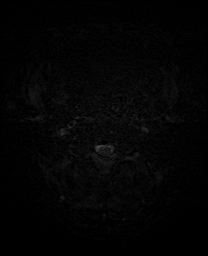
[im 15/60]
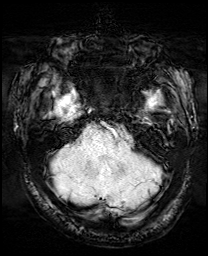
[im 30/60]
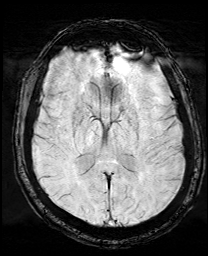
[im 45/60]
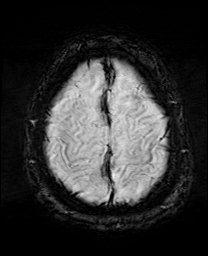
[im 60/60]
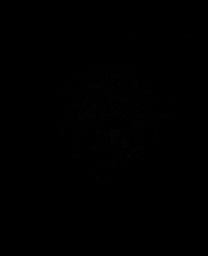

[Series 17: T2 · coronal · 5.0mm · 0.34mm/px · 3 of 31 slices shown (2 of 2)]
[im 1/31]
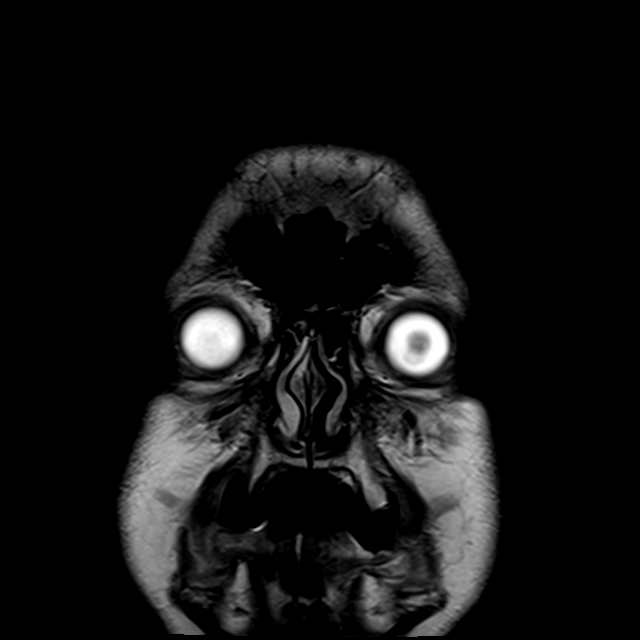
[im 16/31]
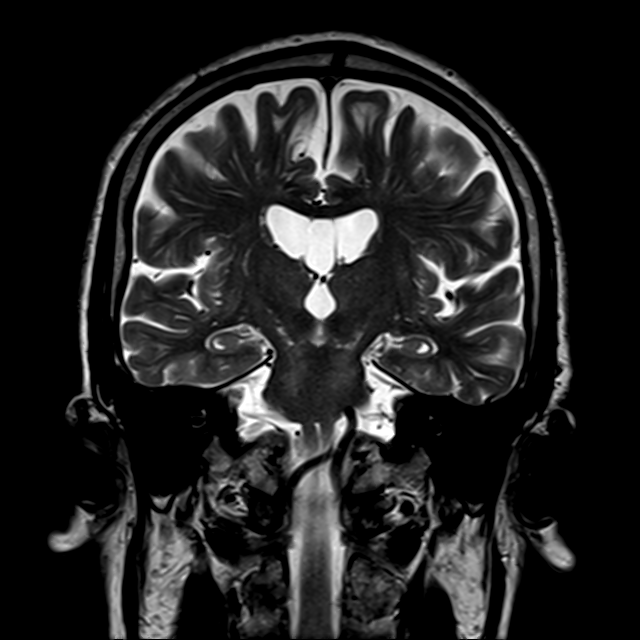
[im 31/31]
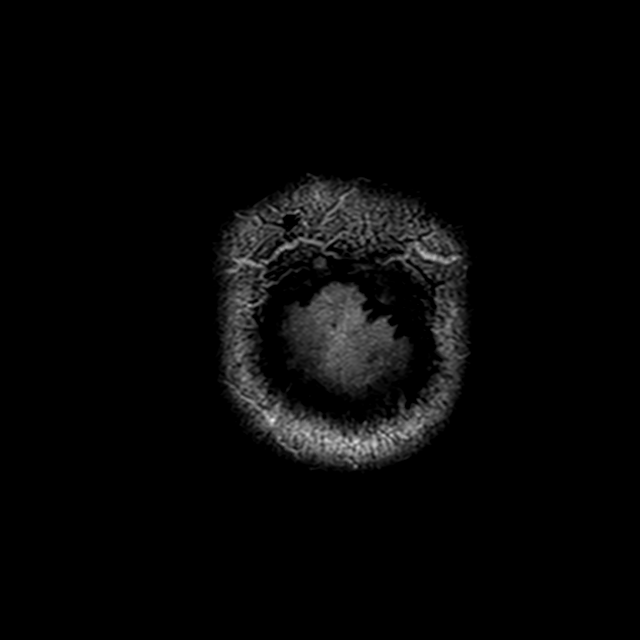

[43 of 48 positions shown; findings below may reference images not displayed]

FINDINGS: Brain: No acute infarction, hemorrhage, hydrocephalus, extra-axial
collection or mass lesion. Incidental ossification along the falx.
Mild cerebral volume loss and chronic white matter changes for age.
Mild chronic small vessel ischemic type change in the pons. No
specific finding in the brainstem, cisterns, or skull base to
explain symptoms.

Vascular: Normal flow voids.

Skull and upper cervical spine: Normal marrow signal.

Sinuses/Orbits: Negative.

Other: None.
IMPRESSION: Senescent changes without acute finding or specific cause of
symptoms.

## 2020-11-05 IMAGING — CT CT HEAD W/O CM
4 series · 16 of 47 positions shown, 18 images · non-contrast
Comparison: None.

CLINICAL DATA: Right facial droop

EXAM:
CT HEAD WITHOUT CONTRAST
TECHNIQUE: Contiguous axial images were obtained from the base of the skull
through the vertex without intravenous contrast.

[Series 3: head wo · axial · 0.47mm/px · z∈[-162,-26]mm · 7 of 37 slices shown, 9 images]
[im 5/37  brain]
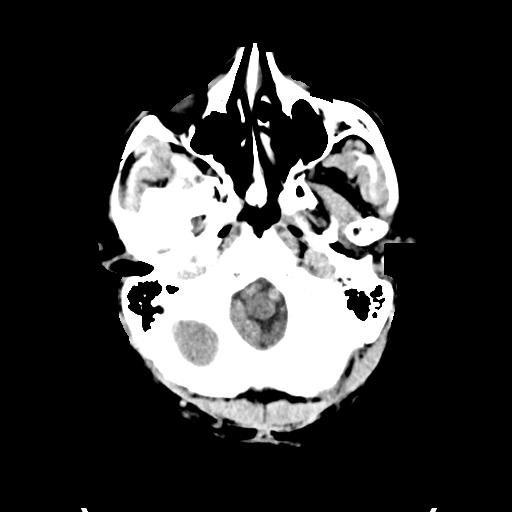
[im 5/37  bone]
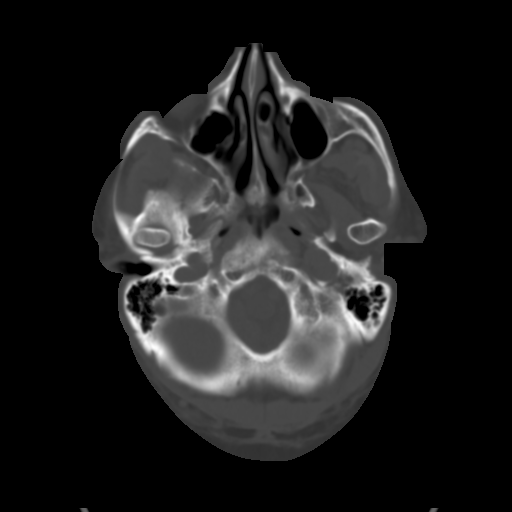
[im 10/37  brain]
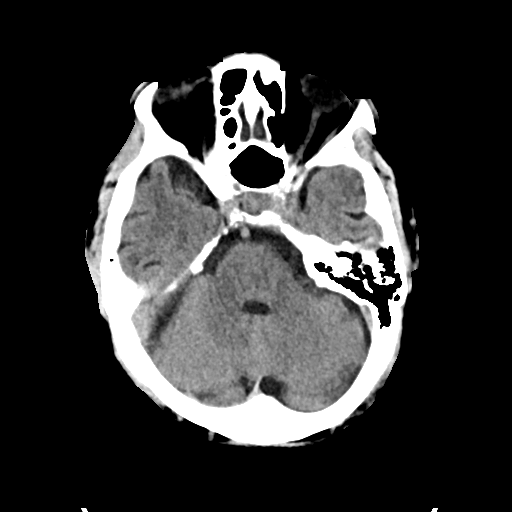
[im 14/37  brain]
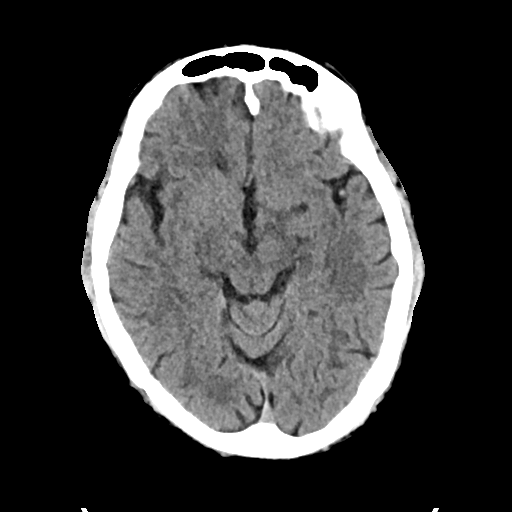
[im 19/37  brain]
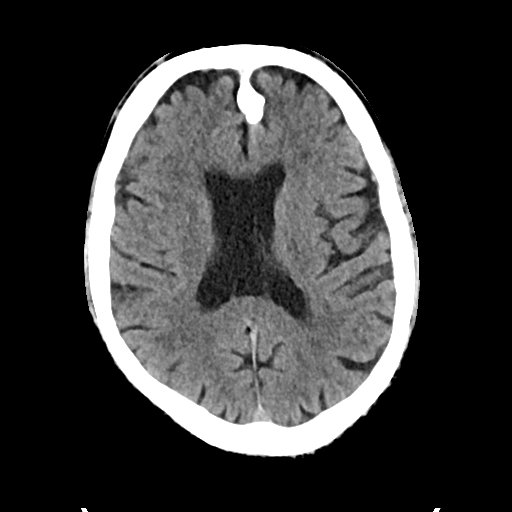
[im 23/37  brain]
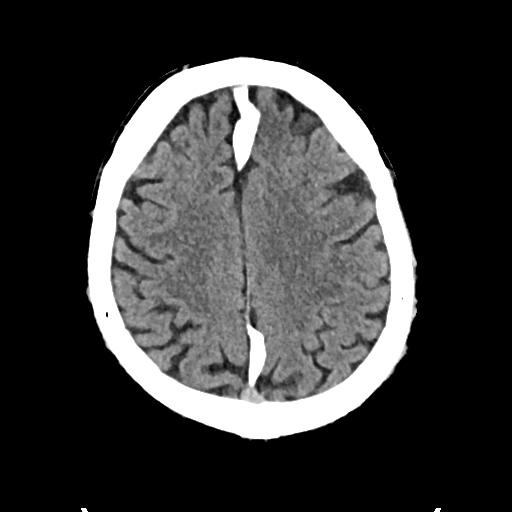
[im 23/37  bone]
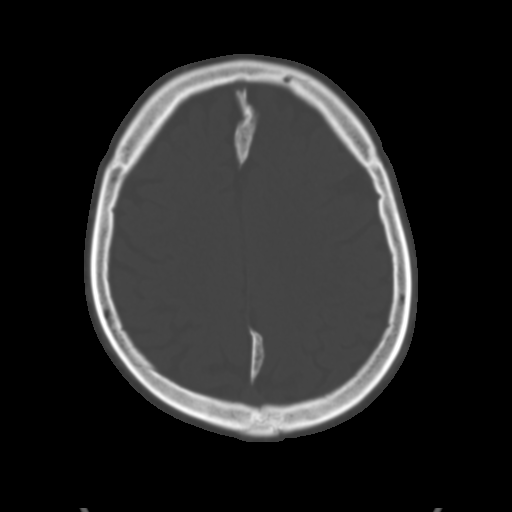
[im 28/37  brain]
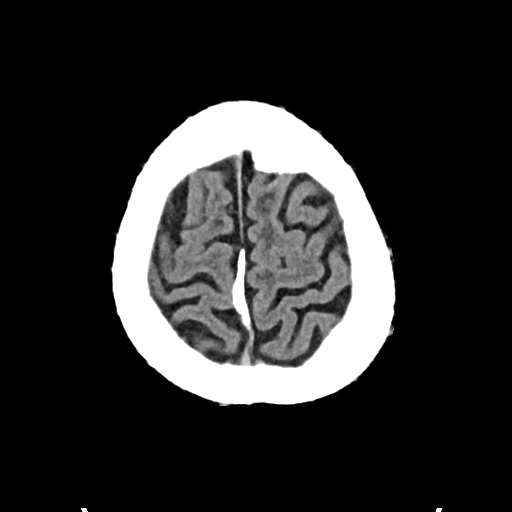
[im 32/37  brain]
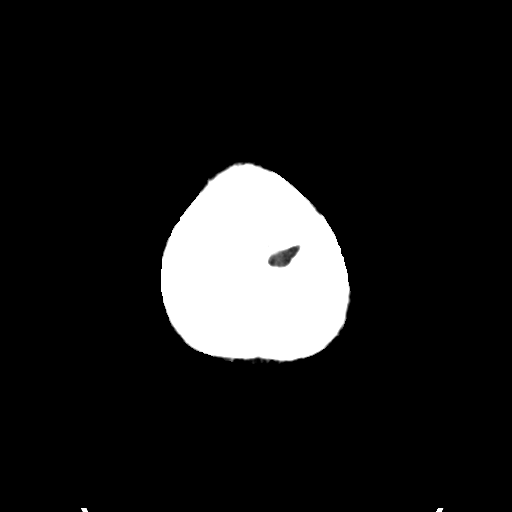

[Series 4: head bone · axial · 0.47mm/px · z∈[-164,-128]mm · 3 of 91 slices shown]
[im 10/91  bone]
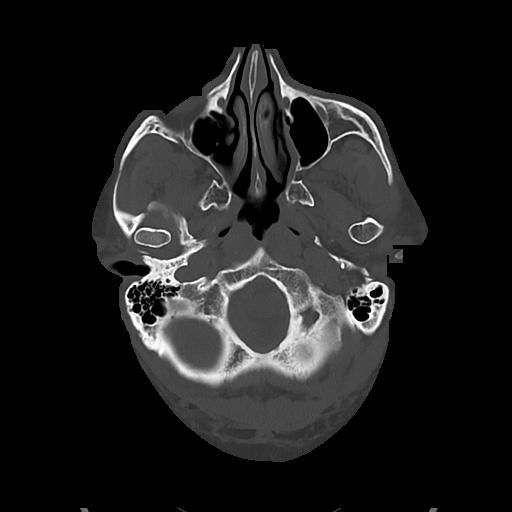
[im 19/91  bone]
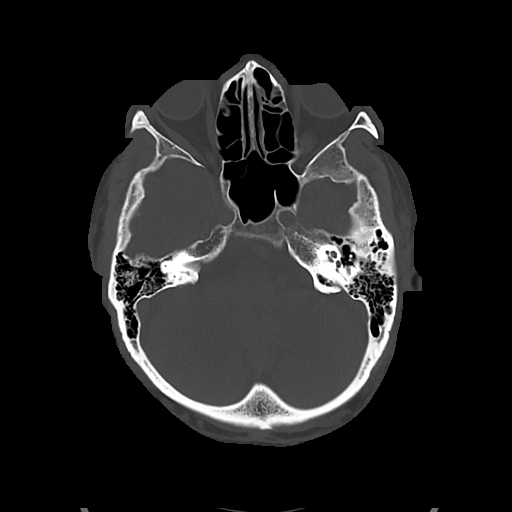
[im 28/91  bone]
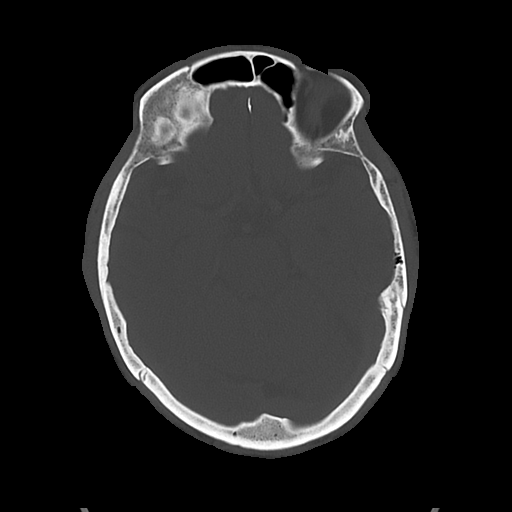

[Series 5: cor soft · coronal · 0.34mm/px · 3 of 75 slices shown]
[im 25/75  brain]
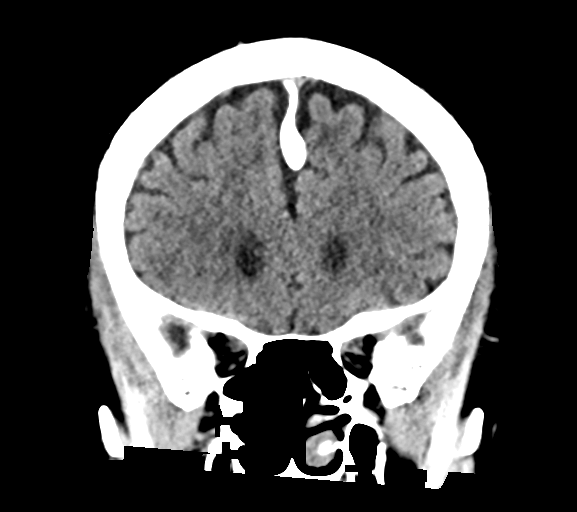
[im 33/75  brain]
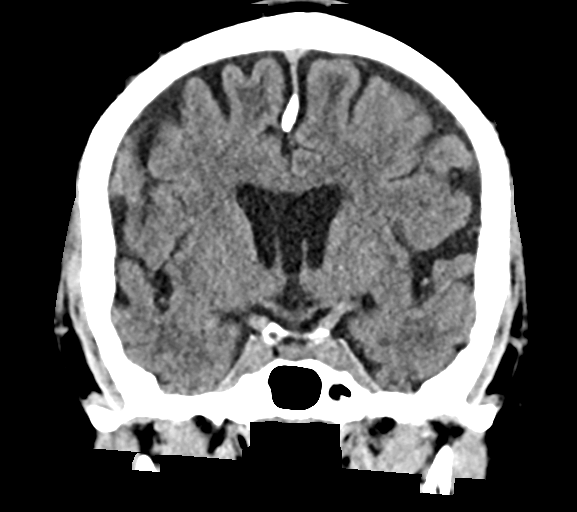
[im 42/75  brain]
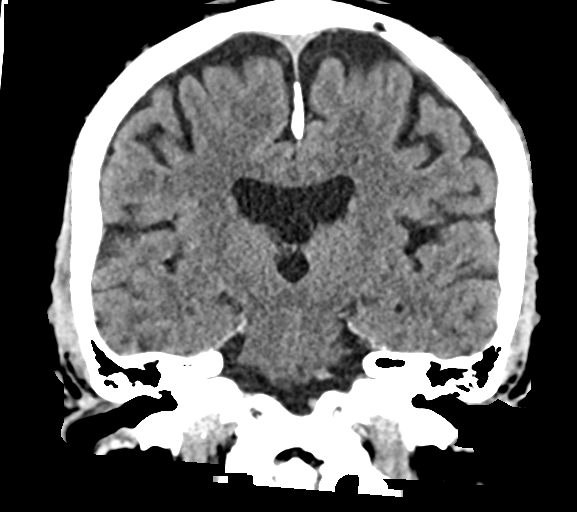

[Series 6: sag soft · sagittal · 0.34mm/px · 3 of 66 slices shown]
[im 22/66  brain]
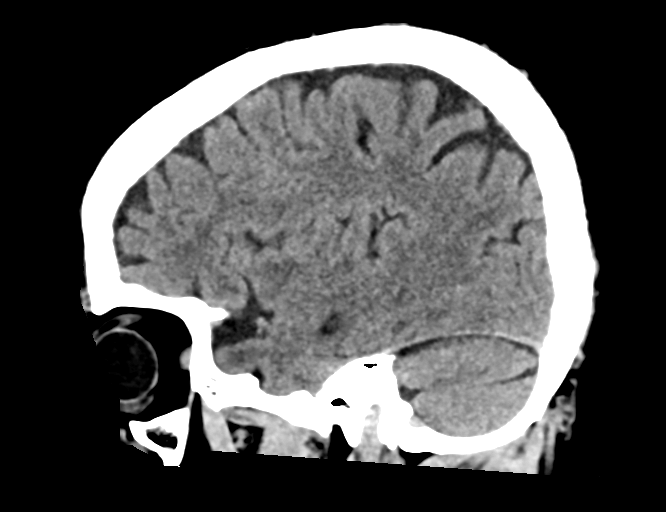
[im 33/66  brain]
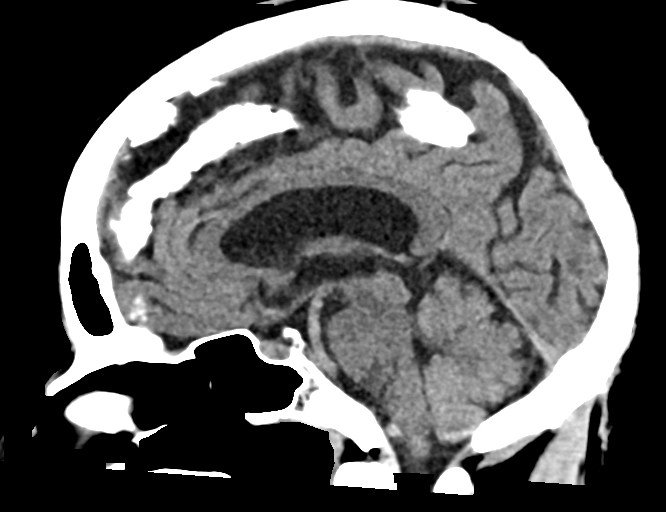
[im 44/66  brain]
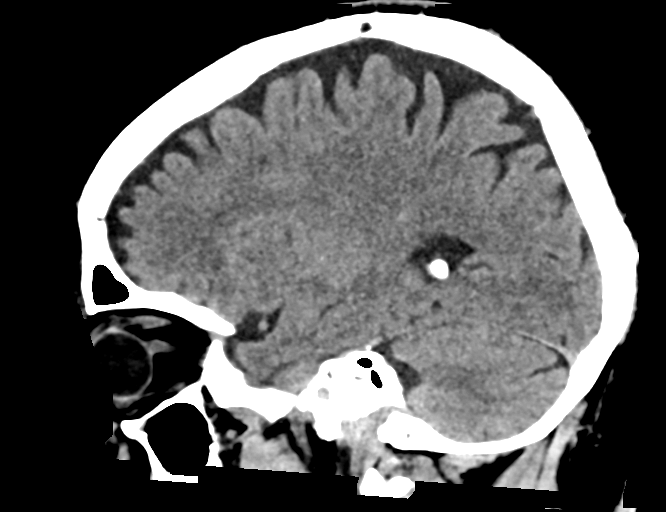

[16 of 47 positions shown; findings below may reference images not displayed]

FINDINGS: Brain: There is atrophy and chronic small vessel disease changes. No
acute intracranial abnormality. Specifically, no hemorrhage,
hydrocephalus, mass lesion, acute infarction, or significant
intracranial injury. Dense dural calcifications.

Vascular: No hyperdense vessel or unexpected calcification.

Skull: No acute calvarial abnormality.

Sinuses/Orbits: Visualized paranasal sinuses and mastoids clear.
Orbital soft tissues unremarkable.

Other: None
IMPRESSION: Atrophy, chronic microvascular disease.

No acute intracranial abnormality.

## 2020-11-28 DIAGNOSIS — H43812 Vitreous degeneration, left eye: Secondary | ICD-10-CM | POA: Diagnosis not present

## 2020-11-28 DIAGNOSIS — H34811 Central retinal vein occlusion, right eye, with macular edema: Secondary | ICD-10-CM | POA: Diagnosis not present

## 2021-01-30 DIAGNOSIS — H43813 Vitreous degeneration, bilateral: Secondary | ICD-10-CM | POA: Diagnosis not present

## 2021-01-30 DIAGNOSIS — H34811 Central retinal vein occlusion, right eye, with macular edema: Secondary | ICD-10-CM | POA: Diagnosis not present

## 2021-01-30 DIAGNOSIS — H35373 Puckering of macula, bilateral: Secondary | ICD-10-CM | POA: Diagnosis not present

## 2021-03-01 DIAGNOSIS — G4733 Obstructive sleep apnea (adult) (pediatric): Secondary | ICD-10-CM | POA: Diagnosis not present

## 2021-03-07 DIAGNOSIS — H43813 Vitreous degeneration, bilateral: Secondary | ICD-10-CM | POA: Diagnosis not present

## 2021-03-07 DIAGNOSIS — H34811 Central retinal vein occlusion, right eye, with macular edema: Secondary | ICD-10-CM | POA: Diagnosis not present

## 2021-03-31 DIAGNOSIS — G4733 Obstructive sleep apnea (adult) (pediatric): Secondary | ICD-10-CM | POA: Diagnosis not present

## 2021-04-02 DIAGNOSIS — G4733 Obstructive sleep apnea (adult) (pediatric): Secondary | ICD-10-CM | POA: Diagnosis not present

## 2021-04-19 DIAGNOSIS — I129 Hypertensive chronic kidney disease with stage 1 through stage 4 chronic kidney disease, or unspecified chronic kidney disease: Secondary | ICD-10-CM | POA: Diagnosis not present

## 2021-04-19 DIAGNOSIS — E785 Hyperlipidemia, unspecified: Secondary | ICD-10-CM | POA: Diagnosis not present

## 2021-04-19 DIAGNOSIS — R300 Dysuria: Secondary | ICD-10-CM | POA: Diagnosis not present

## 2021-04-19 DIAGNOSIS — E119 Type 2 diabetes mellitus without complications: Secondary | ICD-10-CM | POA: Diagnosis not present

## 2021-05-01 DIAGNOSIS — G4733 Obstructive sleep apnea (adult) (pediatric): Secondary | ICD-10-CM | POA: Diagnosis not present

## 2021-05-02 DIAGNOSIS — M109 Gout, unspecified: Secondary | ICD-10-CM | POA: Diagnosis not present

## 2021-05-02 DIAGNOSIS — E559 Vitamin D deficiency, unspecified: Secondary | ICD-10-CM | POA: Diagnosis not present

## 2021-05-02 DIAGNOSIS — I129 Hypertensive chronic kidney disease with stage 1 through stage 4 chronic kidney disease, or unspecified chronic kidney disease: Secondary | ICD-10-CM | POA: Diagnosis not present

## 2021-05-02 DIAGNOSIS — E785 Hyperlipidemia, unspecified: Secondary | ICD-10-CM | POA: Diagnosis not present

## 2021-05-02 DIAGNOSIS — E119 Type 2 diabetes mellitus without complications: Secondary | ICD-10-CM | POA: Diagnosis not present

## 2021-05-08 DIAGNOSIS — G4733 Obstructive sleep apnea (adult) (pediatric): Secondary | ICD-10-CM | POA: Diagnosis not present

## 2021-05-31 DIAGNOSIS — G4733 Obstructive sleep apnea (adult) (pediatric): Secondary | ICD-10-CM | POA: Diagnosis not present

## 2021-06-10 DIAGNOSIS — H3582 Retinal ischemia: Secondary | ICD-10-CM | POA: Diagnosis not present

## 2021-06-10 DIAGNOSIS — H34811 Central retinal vein occlusion, right eye, with macular edema: Secondary | ICD-10-CM | POA: Diagnosis not present

## 2021-06-10 DIAGNOSIS — H43813 Vitreous degeneration, bilateral: Secondary | ICD-10-CM | POA: Diagnosis not present

## 2021-06-10 DIAGNOSIS — H35373 Puckering of macula, bilateral: Secondary | ICD-10-CM | POA: Diagnosis not present

## 2021-06-10 DIAGNOSIS — H35362 Drusen (degenerative) of macula, left eye: Secondary | ICD-10-CM | POA: Diagnosis not present

## 2021-06-19 DIAGNOSIS — H34811 Central retinal vein occlusion, right eye, with macular edema: Secondary | ICD-10-CM | POA: Diagnosis not present

## 2021-06-29 DIAGNOSIS — G4733 Obstructive sleep apnea (adult) (pediatric): Secondary | ICD-10-CM | POA: Diagnosis not present

## 2021-07-01 DIAGNOSIS — G4733 Obstructive sleep apnea (adult) (pediatric): Secondary | ICD-10-CM | POA: Diagnosis not present

## 2021-07-22 DIAGNOSIS — E785 Hyperlipidemia, unspecified: Secondary | ICD-10-CM | POA: Diagnosis not present

## 2021-07-22 DIAGNOSIS — E119 Type 2 diabetes mellitus without complications: Secondary | ICD-10-CM | POA: Diagnosis not present

## 2021-07-22 DIAGNOSIS — M109 Gout, unspecified: Secondary | ICD-10-CM | POA: Diagnosis not present

## 2021-07-22 DIAGNOSIS — I129 Hypertensive chronic kidney disease with stage 1 through stage 4 chronic kidney disease, or unspecified chronic kidney disease: Secondary | ICD-10-CM | POA: Diagnosis not present

## 2021-07-22 DIAGNOSIS — E559 Vitamin D deficiency, unspecified: Secondary | ICD-10-CM | POA: Diagnosis not present

## 2021-07-23 DIAGNOSIS — E113311 Type 2 diabetes mellitus with moderate nonproliferative diabetic retinopathy with macular edema, right eye: Secondary | ICD-10-CM | POA: Diagnosis not present

## 2021-07-23 DIAGNOSIS — H5203 Hypermetropia, bilateral: Secondary | ICD-10-CM | POA: Diagnosis not present

## 2021-07-23 DIAGNOSIS — H524 Presbyopia: Secondary | ICD-10-CM | POA: Diagnosis not present

## 2021-07-23 DIAGNOSIS — H2512 Age-related nuclear cataract, left eye: Secondary | ICD-10-CM | POA: Diagnosis not present

## 2021-08-01 DIAGNOSIS — G4733 Obstructive sleep apnea (adult) (pediatric): Secondary | ICD-10-CM | POA: Diagnosis not present

## 2021-08-22 DIAGNOSIS — H35373 Puckering of macula, bilateral: Secondary | ICD-10-CM | POA: Diagnosis not present

## 2021-08-22 DIAGNOSIS — H34811 Central retinal vein occlusion, right eye, with macular edema: Secondary | ICD-10-CM | POA: Diagnosis not present

## 2021-08-22 DIAGNOSIS — H43822 Vitreomacular adhesion, left eye: Secondary | ICD-10-CM | POA: Diagnosis not present

## 2021-08-22 DIAGNOSIS — H35362 Drusen (degenerative) of macula, left eye: Secondary | ICD-10-CM | POA: Diagnosis not present

## 2021-08-31 DIAGNOSIS — G4733 Obstructive sleep apnea (adult) (pediatric): Secondary | ICD-10-CM | POA: Diagnosis not present

## 2021-09-04 DIAGNOSIS — G4733 Obstructive sleep apnea (adult) (pediatric): Secondary | ICD-10-CM | POA: Diagnosis not present

## 2021-09-20 DIAGNOSIS — G4733 Obstructive sleep apnea (adult) (pediatric): Secondary | ICD-10-CM | POA: Diagnosis not present

## 2021-09-26 DIAGNOSIS — H34811 Central retinal vein occlusion, right eye, with macular edema: Secondary | ICD-10-CM | POA: Diagnosis not present

## 2021-10-01 DIAGNOSIS — G4733 Obstructive sleep apnea (adult) (pediatric): Secondary | ICD-10-CM | POA: Diagnosis not present

## 2021-10-31 DIAGNOSIS — G4733 Obstructive sleep apnea (adult) (pediatric): Secondary | ICD-10-CM | POA: Diagnosis not present

## 2021-11-12 DIAGNOSIS — M109 Gout, unspecified: Secondary | ICD-10-CM | POA: Diagnosis not present

## 2021-11-12 DIAGNOSIS — E119 Type 2 diabetes mellitus without complications: Secondary | ICD-10-CM | POA: Diagnosis not present

## 2021-11-12 DIAGNOSIS — I129 Hypertensive chronic kidney disease with stage 1 through stage 4 chronic kidney disease, or unspecified chronic kidney disease: Secondary | ICD-10-CM | POA: Diagnosis not present

## 2021-11-12 DIAGNOSIS — E785 Hyperlipidemia, unspecified: Secondary | ICD-10-CM | POA: Diagnosis not present

## 2021-11-12 DIAGNOSIS — E559 Vitamin D deficiency, unspecified: Secondary | ICD-10-CM | POA: Diagnosis not present

## 2021-11-12 DIAGNOSIS — R946 Abnormal results of thyroid function studies: Secondary | ICD-10-CM | POA: Diagnosis not present

## 2021-11-28 DIAGNOSIS — E113492 Type 2 diabetes mellitus with severe nonproliferative diabetic retinopathy without macular edema, left eye: Secondary | ICD-10-CM | POA: Diagnosis not present

## 2021-11-28 DIAGNOSIS — H35373 Puckering of macula, bilateral: Secondary | ICD-10-CM | POA: Diagnosis not present

## 2021-11-28 DIAGNOSIS — H34811 Central retinal vein occlusion, right eye, with macular edema: Secondary | ICD-10-CM | POA: Diagnosis not present

## 2021-11-28 DIAGNOSIS — H3582 Retinal ischemia: Secondary | ICD-10-CM | POA: Diagnosis not present

## 2022-02-27 DIAGNOSIS — H43822 Vitreomacular adhesion, left eye: Secondary | ICD-10-CM | POA: Diagnosis not present

## 2022-02-27 DIAGNOSIS — H35373 Puckering of macula, bilateral: Secondary | ICD-10-CM | POA: Diagnosis not present

## 2022-02-27 DIAGNOSIS — H3582 Retinal ischemia: Secondary | ICD-10-CM | POA: Diagnosis not present

## 2022-02-27 DIAGNOSIS — H35362 Drusen (degenerative) of macula, left eye: Secondary | ICD-10-CM | POA: Diagnosis not present

## 2022-02-27 DIAGNOSIS — H34811 Central retinal vein occlusion, right eye, with macular edema: Secondary | ICD-10-CM | POA: Diagnosis not present

## 2022-03-18 DIAGNOSIS — I129 Hypertensive chronic kidney disease with stage 1 through stage 4 chronic kidney disease, or unspecified chronic kidney disease: Secondary | ICD-10-CM | POA: Diagnosis not present

## 2022-03-18 DIAGNOSIS — E785 Hyperlipidemia, unspecified: Secondary | ICD-10-CM | POA: Diagnosis not present

## 2022-03-18 DIAGNOSIS — E119 Type 2 diabetes mellitus without complications: Secondary | ICD-10-CM | POA: Diagnosis not present

## 2022-03-25 DIAGNOSIS — E113551 Type 2 diabetes mellitus with stable proliferative diabetic retinopathy, right eye: Secondary | ICD-10-CM | POA: Diagnosis not present

## 2022-03-25 DIAGNOSIS — Z538 Procedure and treatment not carried out for other reasons: Secondary | ICD-10-CM | POA: Diagnosis not present

## 2022-03-25 DIAGNOSIS — G4733 Obstructive sleep apnea (adult) (pediatric): Secondary | ICD-10-CM | POA: Diagnosis not present

## 2022-03-25 DIAGNOSIS — M109 Gout, unspecified: Secondary | ICD-10-CM | POA: Diagnosis not present

## 2022-03-25 DIAGNOSIS — E785 Hyperlipidemia, unspecified: Secondary | ICD-10-CM | POA: Diagnosis not present

## 2022-03-25 DIAGNOSIS — I129 Hypertensive chronic kidney disease with stage 1 through stage 4 chronic kidney disease, or unspecified chronic kidney disease: Secondary | ICD-10-CM | POA: Diagnosis not present

## 2022-03-29 DIAGNOSIS — G4733 Obstructive sleep apnea (adult) (pediatric): Secondary | ICD-10-CM | POA: Diagnosis not present

## 2022-04-03 DIAGNOSIS — H34811 Central retinal vein occlusion, right eye, with macular edema: Secondary | ICD-10-CM | POA: Diagnosis not present

## 2022-04-03 DIAGNOSIS — H43812 Vitreous degeneration, left eye: Secondary | ICD-10-CM | POA: Diagnosis not present

## 2022-06-05 DIAGNOSIS — H35373 Puckering of macula, bilateral: Secondary | ICD-10-CM | POA: Diagnosis not present

## 2022-06-05 DIAGNOSIS — H43822 Vitreomacular adhesion, left eye: Secondary | ICD-10-CM | POA: Diagnosis not present

## 2022-06-05 DIAGNOSIS — H34811 Central retinal vein occlusion, right eye, with macular edema: Secondary | ICD-10-CM | POA: Diagnosis not present

## 2022-06-05 DIAGNOSIS — H35362 Drusen (degenerative) of macula, left eye: Secondary | ICD-10-CM | POA: Diagnosis not present

## 2022-06-27 DIAGNOSIS — G4733 Obstructive sleep apnea (adult) (pediatric): Secondary | ICD-10-CM | POA: Diagnosis not present

## 2022-07-03 DIAGNOSIS — H35371 Puckering of macula, right eye: Secondary | ICD-10-CM | POA: Diagnosis not present

## 2022-07-03 DIAGNOSIS — H43822 Vitreomacular adhesion, left eye: Secondary | ICD-10-CM | POA: Diagnosis not present

## 2022-07-03 DIAGNOSIS — H34811 Central retinal vein occlusion, right eye, with macular edema: Secondary | ICD-10-CM | POA: Diagnosis not present

## 2022-07-23 DIAGNOSIS — E113551 Type 2 diabetes mellitus with stable proliferative diabetic retinopathy, right eye: Secondary | ICD-10-CM | POA: Diagnosis not present

## 2022-07-23 DIAGNOSIS — E785 Hyperlipidemia, unspecified: Secondary | ICD-10-CM | POA: Diagnosis not present

## 2022-07-23 DIAGNOSIS — I129 Hypertensive chronic kidney disease with stage 1 through stage 4 chronic kidney disease, or unspecified chronic kidney disease: Secondary | ICD-10-CM | POA: Diagnosis not present

## 2022-07-23 DIAGNOSIS — M109 Gout, unspecified: Secondary | ICD-10-CM | POA: Diagnosis not present

## 2022-07-30 DIAGNOSIS — E785 Hyperlipidemia, unspecified: Secondary | ICD-10-CM | POA: Diagnosis not present

## 2022-07-30 DIAGNOSIS — E113551 Type 2 diabetes mellitus with stable proliferative diabetic retinopathy, right eye: Secondary | ICD-10-CM | POA: Diagnosis not present

## 2022-07-30 DIAGNOSIS — R7989 Other specified abnormal findings of blood chemistry: Secondary | ICD-10-CM | POA: Diagnosis not present

## 2022-07-30 DIAGNOSIS — M109 Gout, unspecified: Secondary | ICD-10-CM | POA: Diagnosis not present

## 2022-07-30 DIAGNOSIS — I129 Hypertensive chronic kidney disease with stage 1 through stage 4 chronic kidney disease, or unspecified chronic kidney disease: Secondary | ICD-10-CM | POA: Diagnosis not present

## 2022-09-04 DIAGNOSIS — H35373 Puckering of macula, bilateral: Secondary | ICD-10-CM | POA: Diagnosis not present

## 2022-09-04 DIAGNOSIS — H34811 Central retinal vein occlusion, right eye, with macular edema: Secondary | ICD-10-CM | POA: Diagnosis not present

## 2022-09-04 DIAGNOSIS — H35362 Drusen (degenerative) of macula, left eye: Secondary | ICD-10-CM | POA: Diagnosis not present

## 2022-09-04 DIAGNOSIS — H43822 Vitreomacular adhesion, left eye: Secondary | ICD-10-CM | POA: Diagnosis not present

## 2022-09-17 DIAGNOSIS — H34811 Central retinal vein occlusion, right eye, with macular edema: Secondary | ICD-10-CM | POA: Diagnosis not present

## 2022-09-17 DIAGNOSIS — H35371 Puckering of macula, right eye: Secondary | ICD-10-CM | POA: Diagnosis not present

## 2022-09-25 DIAGNOSIS — H34811 Central retinal vein occlusion, right eye, with macular edema: Secondary | ICD-10-CM | POA: Diagnosis not present

## 2022-10-16 DIAGNOSIS — H34811 Central retinal vein occlusion, right eye, with macular edema: Secondary | ICD-10-CM | POA: Diagnosis not present

## 2022-12-18 DIAGNOSIS — H43822 Vitreomacular adhesion, left eye: Secondary | ICD-10-CM | POA: Diagnosis not present

## 2022-12-18 DIAGNOSIS — H35033 Hypertensive retinopathy, bilateral: Secondary | ICD-10-CM | POA: Diagnosis not present

## 2022-12-18 DIAGNOSIS — H34811 Central retinal vein occlusion, right eye, with macular edema: Secondary | ICD-10-CM | POA: Diagnosis not present

## 2022-12-18 DIAGNOSIS — H43812 Vitreous degeneration, left eye: Secondary | ICD-10-CM | POA: Diagnosis not present

## 2023-01-22 DIAGNOSIS — H34811 Central retinal vein occlusion, right eye, with macular edema: Secondary | ICD-10-CM | POA: Diagnosis not present

## 2023-01-23 DIAGNOSIS — E113551 Type 2 diabetes mellitus with stable proliferative diabetic retinopathy, right eye: Secondary | ICD-10-CM | POA: Diagnosis not present

## 2023-01-23 DIAGNOSIS — R7989 Other specified abnormal findings of blood chemistry: Secondary | ICD-10-CM | POA: Diagnosis not present

## 2023-01-23 DIAGNOSIS — M109 Gout, unspecified: Secondary | ICD-10-CM | POA: Diagnosis not present

## 2023-01-23 DIAGNOSIS — G4733 Obstructive sleep apnea (adult) (pediatric): Secondary | ICD-10-CM | POA: Diagnosis not present

## 2023-01-23 DIAGNOSIS — E785 Hyperlipidemia, unspecified: Secondary | ICD-10-CM | POA: Diagnosis not present

## 2023-01-23 DIAGNOSIS — I129 Hypertensive chronic kidney disease with stage 1 through stage 4 chronic kidney disease, or unspecified chronic kidney disease: Secondary | ICD-10-CM | POA: Diagnosis not present

## 2023-01-29 DIAGNOSIS — Z23 Encounter for immunization: Secondary | ICD-10-CM | POA: Diagnosis not present

## 2023-01-29 DIAGNOSIS — I129 Hypertensive chronic kidney disease with stage 1 through stage 4 chronic kidney disease, or unspecified chronic kidney disease: Secondary | ICD-10-CM | POA: Diagnosis not present

## 2023-01-29 DIAGNOSIS — Z0000 Encounter for general adult medical examination without abnormal findings: Secondary | ICD-10-CM | POA: Diagnosis not present

## 2023-01-29 DIAGNOSIS — E785 Hyperlipidemia, unspecified: Secondary | ICD-10-CM | POA: Diagnosis not present

## 2023-01-29 DIAGNOSIS — G4733 Obstructive sleep apnea (adult) (pediatric): Secondary | ICD-10-CM | POA: Diagnosis not present

## 2023-01-29 DIAGNOSIS — E113551 Type 2 diabetes mellitus with stable proliferative diabetic retinopathy, right eye: Secondary | ICD-10-CM | POA: Diagnosis not present

## 2023-01-29 DIAGNOSIS — M109 Gout, unspecified: Secondary | ICD-10-CM | POA: Diagnosis not present

## 2023-02-10 DIAGNOSIS — E113551 Type 2 diabetes mellitus with stable proliferative diabetic retinopathy, right eye: Secondary | ICD-10-CM | POA: Diagnosis not present

## 2023-02-10 DIAGNOSIS — G510 Bell's palsy: Secondary | ICD-10-CM | POA: Diagnosis not present

## 2023-02-25 DIAGNOSIS — H43822 Vitreomacular adhesion, left eye: Secondary | ICD-10-CM | POA: Diagnosis not present

## 2023-02-25 DIAGNOSIS — H35033 Hypertensive retinopathy, bilateral: Secondary | ICD-10-CM | POA: Diagnosis not present

## 2023-02-25 DIAGNOSIS — H34811 Central retinal vein occlusion, right eye, with macular edema: Secondary | ICD-10-CM | POA: Diagnosis not present

## 2023-04-02 DIAGNOSIS — E113212 Type 2 diabetes mellitus with mild nonproliferative diabetic retinopathy with macular edema, left eye: Secondary | ICD-10-CM | POA: Diagnosis not present

## 2023-04-02 DIAGNOSIS — H34811 Central retinal vein occlusion, right eye, with macular edema: Secondary | ICD-10-CM | POA: Diagnosis not present

## 2023-04-02 DIAGNOSIS — H35033 Hypertensive retinopathy, bilateral: Secondary | ICD-10-CM | POA: Diagnosis not present

## 2023-04-02 DIAGNOSIS — H43822 Vitreomacular adhesion, left eye: Secondary | ICD-10-CM | POA: Diagnosis not present

## 2023-04-23 DIAGNOSIS — H34811 Central retinal vein occlusion, right eye, with macular edema: Secondary | ICD-10-CM | POA: Diagnosis not present

## 2023-05-27 DIAGNOSIS — G4733 Obstructive sleep apnea (adult) (pediatric): Secondary | ICD-10-CM | POA: Diagnosis not present

## 2023-06-01 DIAGNOSIS — H43812 Vitreous degeneration, left eye: Secondary | ICD-10-CM | POA: Diagnosis not present

## 2023-06-01 DIAGNOSIS — H35033 Hypertensive retinopathy, bilateral: Secondary | ICD-10-CM | POA: Diagnosis not present

## 2023-06-01 DIAGNOSIS — H34811 Central retinal vein occlusion, right eye, with macular edema: Secondary | ICD-10-CM | POA: Diagnosis not present

## 2023-06-01 DIAGNOSIS — H35372 Puckering of macula, left eye: Secondary | ICD-10-CM | POA: Diagnosis not present

## 2023-06-01 DIAGNOSIS — H43822 Vitreomacular adhesion, left eye: Secondary | ICD-10-CM | POA: Diagnosis not present

## 2023-07-23 DIAGNOSIS — H34811 Central retinal vein occlusion, right eye, with macular edema: Secondary | ICD-10-CM | POA: Diagnosis not present

## 2023-07-29 DIAGNOSIS — E113551 Type 2 diabetes mellitus with stable proliferative diabetic retinopathy, right eye: Secondary | ICD-10-CM | POA: Diagnosis not present

## 2023-07-29 DIAGNOSIS — I129 Hypertensive chronic kidney disease with stage 1 through stage 4 chronic kidney disease, or unspecified chronic kidney disease: Secondary | ICD-10-CM | POA: Diagnosis not present

## 2023-07-29 DIAGNOSIS — E785 Hyperlipidemia, unspecified: Secondary | ICD-10-CM | POA: Diagnosis not present

## 2023-07-30 DIAGNOSIS — R059 Cough, unspecified: Secondary | ICD-10-CM | POA: Diagnosis not present

## 2023-07-30 DIAGNOSIS — M109 Gout, unspecified: Secondary | ICD-10-CM | POA: Diagnosis not present

## 2023-07-30 DIAGNOSIS — E785 Hyperlipidemia, unspecified: Secondary | ICD-10-CM | POA: Diagnosis not present

## 2023-07-30 DIAGNOSIS — R946 Abnormal results of thyroid function studies: Secondary | ICD-10-CM | POA: Diagnosis not present

## 2023-07-30 DIAGNOSIS — I129 Hypertensive chronic kidney disease with stage 1 through stage 4 chronic kidney disease, or unspecified chronic kidney disease: Secondary | ICD-10-CM | POA: Diagnosis not present

## 2023-07-30 DIAGNOSIS — E113551 Type 2 diabetes mellitus with stable proliferative diabetic retinopathy, right eye: Secondary | ICD-10-CM | POA: Diagnosis not present

## 2023-07-30 DIAGNOSIS — R509 Fever, unspecified: Secondary | ICD-10-CM | POA: Diagnosis not present

## 2023-08-10 DIAGNOSIS — H34811 Central retinal vein occlusion, right eye, with macular edema: Secondary | ICD-10-CM | POA: Diagnosis not present

## 2023-09-17 DIAGNOSIS — H43822 Vitreomacular adhesion, left eye: Secondary | ICD-10-CM | POA: Diagnosis not present

## 2023-09-17 DIAGNOSIS — H34811 Central retinal vein occlusion, right eye, with macular edema: Secondary | ICD-10-CM | POA: Diagnosis not present

## 2023-09-17 DIAGNOSIS — H2512 Age-related nuclear cataract, left eye: Secondary | ICD-10-CM | POA: Diagnosis not present

## 2023-09-17 DIAGNOSIS — E119 Type 2 diabetes mellitus without complications: Secondary | ICD-10-CM | POA: Diagnosis not present

## 2023-09-17 DIAGNOSIS — H35033 Hypertensive retinopathy, bilateral: Secondary | ICD-10-CM | POA: Diagnosis not present

## 2023-09-17 DIAGNOSIS — H35372 Puckering of macula, left eye: Secondary | ICD-10-CM | POA: Diagnosis not present

## 2023-09-17 DIAGNOSIS — H43812 Vitreous degeneration, left eye: Secondary | ICD-10-CM | POA: Diagnosis not present

## 2023-10-22 DIAGNOSIS — H35033 Hypertensive retinopathy, bilateral: Secondary | ICD-10-CM | POA: Diagnosis not present

## 2023-10-22 DIAGNOSIS — H43822 Vitreomacular adhesion, left eye: Secondary | ICD-10-CM | POA: Diagnosis not present

## 2023-10-22 DIAGNOSIS — H35372 Puckering of macula, left eye: Secondary | ICD-10-CM | POA: Diagnosis not present

## 2023-10-22 DIAGNOSIS — H2512 Age-related nuclear cataract, left eye: Secondary | ICD-10-CM | POA: Diagnosis not present

## 2023-10-22 DIAGNOSIS — H43812 Vitreous degeneration, left eye: Secondary | ICD-10-CM | POA: Diagnosis not present

## 2023-10-22 DIAGNOSIS — H34811 Central retinal vein occlusion, right eye, with macular edema: Secondary | ICD-10-CM | POA: Diagnosis not present

## 2023-10-22 DIAGNOSIS — E119 Type 2 diabetes mellitus without complications: Secondary | ICD-10-CM | POA: Diagnosis not present

## 2023-11-12 DIAGNOSIS — H34811 Central retinal vein occlusion, right eye, with macular edema: Secondary | ICD-10-CM | POA: Diagnosis not present

## 2023-12-17 DIAGNOSIS — H43822 Vitreomacular adhesion, left eye: Secondary | ICD-10-CM | POA: Diagnosis not present

## 2023-12-17 DIAGNOSIS — H35033 Hypertensive retinopathy, bilateral: Secondary | ICD-10-CM | POA: Diagnosis not present

## 2023-12-17 DIAGNOSIS — E119 Type 2 diabetes mellitus without complications: Secondary | ICD-10-CM | POA: Diagnosis not present

## 2023-12-17 DIAGNOSIS — H35372 Puckering of macula, left eye: Secondary | ICD-10-CM | POA: Diagnosis not present

## 2023-12-17 DIAGNOSIS — H43812 Vitreous degeneration, left eye: Secondary | ICD-10-CM | POA: Diagnosis not present

## 2023-12-17 DIAGNOSIS — H2512 Age-related nuclear cataract, left eye: Secondary | ICD-10-CM | POA: Diagnosis not present

## 2023-12-17 DIAGNOSIS — H34811 Central retinal vein occlusion, right eye, with macular edema: Secondary | ICD-10-CM | POA: Diagnosis not present

## 2024-01-25 DIAGNOSIS — H34811 Central retinal vein occlusion, right eye, with macular edema: Secondary | ICD-10-CM | POA: Diagnosis not present

## 2024-01-27 DIAGNOSIS — E785 Hyperlipidemia, unspecified: Secondary | ICD-10-CM | POA: Diagnosis not present

## 2024-01-27 DIAGNOSIS — E113551 Type 2 diabetes mellitus with stable proliferative diabetic retinopathy, right eye: Secondary | ICD-10-CM | POA: Diagnosis not present

## 2024-01-27 DIAGNOSIS — M109 Gout, unspecified: Secondary | ICD-10-CM | POA: Diagnosis not present

## 2024-01-27 DIAGNOSIS — I129 Hypertensive chronic kidney disease with stage 1 through stage 4 chronic kidney disease, or unspecified chronic kidney disease: Secondary | ICD-10-CM | POA: Diagnosis not present

## 2024-02-17 DIAGNOSIS — G4733 Obstructive sleep apnea (adult) (pediatric): Secondary | ICD-10-CM | POA: Diagnosis not present

## 2024-02-17 DIAGNOSIS — Z0000 Encounter for general adult medical examination without abnormal findings: Secondary | ICD-10-CM | POA: Diagnosis not present

## 2024-02-17 DIAGNOSIS — I129 Hypertensive chronic kidney disease with stage 1 through stage 4 chronic kidney disease, or unspecified chronic kidney disease: Secondary | ICD-10-CM | POA: Diagnosis not present

## 2024-03-03 DIAGNOSIS — H35372 Puckering of macula, left eye: Secondary | ICD-10-CM | POA: Diagnosis not present

## 2024-03-03 DIAGNOSIS — T8522XA Displacement of intraocular lens, initial encounter: Secondary | ICD-10-CM | POA: Diagnosis not present

## 2024-03-03 DIAGNOSIS — E119 Type 2 diabetes mellitus without complications: Secondary | ICD-10-CM | POA: Diagnosis not present

## 2024-03-03 DIAGNOSIS — H34811 Central retinal vein occlusion, right eye, with macular edema: Secondary | ICD-10-CM | POA: Diagnosis not present

## 2024-03-03 DIAGNOSIS — H35033 Hypertensive retinopathy, bilateral: Secondary | ICD-10-CM | POA: Diagnosis not present

## 2024-03-03 DIAGNOSIS — H2512 Age-related nuclear cataract, left eye: Secondary | ICD-10-CM | POA: Diagnosis not present

## 2024-03-03 DIAGNOSIS — H43812 Vitreous degeneration, left eye: Secondary | ICD-10-CM | POA: Diagnosis not present

## 2024-04-06 DIAGNOSIS — T8522XA Displacement of intraocular lens, initial encounter: Secondary | ICD-10-CM | POA: Diagnosis not present

## 2024-04-28 DIAGNOSIS — H34811 Central retinal vein occlusion, right eye, with macular edema: Secondary | ICD-10-CM | POA: Diagnosis not present

## 2024-05-30 DIAGNOSIS — H34811 Central retinal vein occlusion, right eye, with macular edema: Secondary | ICD-10-CM | POA: Diagnosis not present

## 2024-08-08 DIAGNOSIS — H35372 Puckering of macula, left eye: Secondary | ICD-10-CM | POA: Diagnosis not present

## 2024-08-08 DIAGNOSIS — E119 Type 2 diabetes mellitus without complications: Secondary | ICD-10-CM | POA: Diagnosis not present

## 2024-08-08 DIAGNOSIS — H35033 Hypertensive retinopathy, bilateral: Secondary | ICD-10-CM | POA: Diagnosis not present

## 2024-08-08 DIAGNOSIS — H34811 Central retinal vein occlusion, right eye, with macular edema: Secondary | ICD-10-CM | POA: Diagnosis not present

## 2024-08-08 DIAGNOSIS — H43812 Vitreous degeneration, left eye: Secondary | ICD-10-CM | POA: Diagnosis not present

## 2024-08-08 DIAGNOSIS — H2512 Age-related nuclear cataract, left eye: Secondary | ICD-10-CM | POA: Diagnosis not present

## 2024-08-12 DIAGNOSIS — E1122 Type 2 diabetes mellitus with diabetic chronic kidney disease: Secondary | ICD-10-CM | POA: Diagnosis not present

## 2024-08-12 DIAGNOSIS — E785 Hyperlipidemia, unspecified: Secondary | ICD-10-CM | POA: Diagnosis not present

## 2024-08-12 DIAGNOSIS — M109 Gout, unspecified: Secondary | ICD-10-CM | POA: Diagnosis not present

## 2024-08-12 DIAGNOSIS — E559 Vitamin D deficiency, unspecified: Secondary | ICD-10-CM | POA: Diagnosis not present

## 2024-08-19 DIAGNOSIS — E785 Hyperlipidemia, unspecified: Secondary | ICD-10-CM | POA: Diagnosis not present

## 2024-08-19 DIAGNOSIS — I129 Hypertensive chronic kidney disease with stage 1 through stage 4 chronic kidney disease, or unspecified chronic kidney disease: Secondary | ICD-10-CM | POA: Diagnosis not present

## 2024-08-19 DIAGNOSIS — R946 Abnormal results of thyroid function studies: Secondary | ICD-10-CM | POA: Diagnosis not present

## 2024-08-19 DIAGNOSIS — G4733 Obstructive sleep apnea (adult) (pediatric): Secondary | ICD-10-CM | POA: Diagnosis not present

## 2024-08-19 DIAGNOSIS — Z638 Other specified problems related to primary support group: Secondary | ICD-10-CM | POA: Diagnosis not present

## 2024-08-19 DIAGNOSIS — E559 Vitamin D deficiency, unspecified: Secondary | ICD-10-CM | POA: Diagnosis not present

## 2024-08-19 DIAGNOSIS — E113312 Type 2 diabetes mellitus with moderate nonproliferative diabetic retinopathy with macular edema, left eye: Secondary | ICD-10-CM | POA: Diagnosis not present

## 2024-08-19 DIAGNOSIS — M109 Gout, unspecified: Secondary | ICD-10-CM | POA: Diagnosis not present

## 2024-08-19 DIAGNOSIS — E113551 Type 2 diabetes mellitus with stable proliferative diabetic retinopathy, right eye: Secondary | ICD-10-CM | POA: Diagnosis not present

## 2024-09-19 DIAGNOSIS — H34811 Central retinal vein occlusion, right eye, with macular edema: Secondary | ICD-10-CM | POA: Diagnosis not present
